# Patient Record
Sex: Female | Born: 1955 | Race: White | Hispanic: No | Smoking: Current every day smoker
Health system: Southern US, Community
[De-identification: ages and names within clinical notes are randomized; demographics above are authoritative.]

## PROBLEM LIST (undated history)

## (undated) DIAGNOSIS — I10 Essential (primary) hypertension: Secondary | ICD-10-CM

## (undated) DIAGNOSIS — F419 Anxiety disorder, unspecified: Secondary | ICD-10-CM

## (undated) DIAGNOSIS — R918 Other nonspecific abnormal finding of lung field: Secondary | ICD-10-CM

## (undated) DIAGNOSIS — E538 Deficiency of other specified B group vitamins: Secondary | ICD-10-CM

## (undated) DIAGNOSIS — I700 Atherosclerosis of aorta: Secondary | ICD-10-CM

## (undated) DIAGNOSIS — F32A Depression, unspecified: Secondary | ICD-10-CM

## (undated) DIAGNOSIS — R9389 Abnormal findings on diagnostic imaging of other specified body structures: Secondary | ICD-10-CM

## (undated) DIAGNOSIS — L409 Psoriasis, unspecified: Secondary | ICD-10-CM

## (undated) DIAGNOSIS — E669 Obesity, unspecified: Secondary | ICD-10-CM

## (undated) DIAGNOSIS — N393 Stress incontinence (female) (male): Secondary | ICD-10-CM

## (undated) DIAGNOSIS — E559 Vitamin D deficiency, unspecified: Secondary | ICD-10-CM

## (undated) DIAGNOSIS — R011 Cardiac murmur, unspecified: Secondary | ICD-10-CM

## (undated) DIAGNOSIS — Z720 Tobacco use: Secondary | ICD-10-CM

## (undated) DIAGNOSIS — E785 Hyperlipidemia, unspecified: Secondary | ICD-10-CM

## (undated) DIAGNOSIS — G479 Sleep disorder, unspecified: Secondary | ICD-10-CM

## (undated) DIAGNOSIS — F329 Major depressive disorder, single episode, unspecified: Secondary | ICD-10-CM

## (undated) HISTORY — DX: Hyperlipidemia, unspecified: E78.5

## (undated) HISTORY — DX: Psoriasis, unspecified: L40.9

## (undated) HISTORY — DX: Abnormal findings on diagnostic imaging of other specified body structures: R93.89

## (undated) HISTORY — DX: Deficiency of other specified B group vitamins: E53.8

## (undated) HISTORY — DX: Obesity, unspecified: E66.9

## (undated) HISTORY — DX: Tobacco use: Z72.0

## (undated) HISTORY — PX: MYOMECTOMY: SHX85

## (undated) HISTORY — DX: Depression, unspecified: F32.A

## (undated) HISTORY — DX: Essential (primary) hypertension: I10

## (undated) HISTORY — DX: Sleep disorder, unspecified: G47.9

## (undated) HISTORY — DX: Atherosclerosis of aorta: I70.0

## (undated) HISTORY — DX: Other nonspecific abnormal finding of lung field: R91.8

## (undated) HISTORY — DX: Cardiac murmur, unspecified: R01.1

## (undated) HISTORY — DX: Stress incontinence (female) (male): N39.3

## (undated) HISTORY — PX: TONSILLECTOMY: SUR1361

## (undated) HISTORY — DX: Anxiety disorder, unspecified: F41.9

## (undated) HISTORY — DX: Vitamin D deficiency, unspecified: E55.9

## (undated) HISTORY — DX: Major depressive disorder, single episode, unspecified: F32.9

---

## 2011-02-20 ENCOUNTER — Other Ambulatory Visit (HOSPITAL_COMMUNITY)
Admission: RE | Admit: 2011-02-20 | Discharge: 2011-02-20 | Disposition: A | Discharge status: Home / Self Care | Payer: 59 | Source: Ambulatory Visit | Attending: Family Medicine | Admitting: Family Medicine

## 2011-02-20 DIAGNOSIS — Z0000 Encounter for general adult medical examination without abnormal findings: Secondary | ICD-10-CM | POA: Insufficient documentation

## 2015-07-28 ENCOUNTER — Other Ambulatory Visit (HOSPITAL_COMMUNITY)
Admission: RE | Admit: 2015-07-28 | Discharge: 2015-07-28 | Disposition: A | Discharge status: Home / Self Care | Payer: 59 | Source: Ambulatory Visit | Attending: Physician Assistant | Admitting: Physician Assistant

## 2015-07-28 DIAGNOSIS — Z01419 Encounter for gynecological examination (general) (routine) without abnormal findings: Secondary | ICD-10-CM | POA: Diagnosis not present

## 2015-07-28 DIAGNOSIS — Z1151 Encounter for screening for human papillomavirus (HPV): Secondary | ICD-10-CM | POA: Insufficient documentation

## 2016-05-02 ENCOUNTER — Ambulatory Visit (INDEPENDENT_AMBULATORY_CARE_PROVIDER_SITE_OTHER): Payer: 59 | Admitting: Emergency Medicine

## 2016-05-02 VITALS — BP 128/80 | HR 81 | Temp 97.8°F | Resp 15 | Ht 59.0 in | Wt 185.0 lb

## 2016-05-02 DIAGNOSIS — W57XXXA Bitten or stung by nonvenomous insect and other nonvenomous arthropods, initial encounter: Secondary | ICD-10-CM

## 2016-05-02 DIAGNOSIS — S30861A Insect bite (nonvenomous) of abdominal wall, initial encounter: Secondary | ICD-10-CM | POA: Diagnosis not present

## 2016-05-02 MED ORDER — DOXYCYCLINE HYCLATE 100 MG PO TABS: ORAL_TABLET | ORAL | Status: DC

## 2016-05-02 NOTE — Progress Notes (Signed)
By signing my name below, I, Raven Small, attest that this documentation has been prepared under the direction and in the presence of Lesle ChrisSteven Uriah Trueba, MD.  Electronically Signed: Andrew Auaven Small, ED Scribe. 05/02/2016. 10:26 AM.  Chief Complaint:  Chief Complaint  Patient presents with  . Tick Removal    Left side. Pt states attempted to remove tick but head still there.     HPI: Lisa Montes is a 60 y.o. female who [facility] today for a tick removal from left flank. Pt mowed the lawn 3 days ago and also walks her dog every morning. She noticed tick while scratching her left side, scratching off the body of tick. She attempted to remove the head of tick herself. States her dog has been treated for ticks.    Past Medical History  Diagnosis Date  . Psoriasis   . Depression    No past surgical history on file. Social History   Social History  . Marital Status: Unknown    Spouse Name: N/A  . Number of Children: N/A  . Years of Education: N/A   Social History Main Topics  . Smoking status: Current Every Day Smoker  . Smokeless tobacco: None  . Alcohol Use: None  . Drug Use: None  . Sexual Activity: Not Asked   Other Topics Concern  . None   Social History Narrative   Family History  Problem Relation Age of Onset  . Hypertension Mother   . Hyperlipidemia Mother   . Kidney disease Mother   . Bladder Cancer Father   . Hypertension Sister   . Diabetes Mellitus I Sister   . Hyperlipidemia Sister   . Hypertension Brother   . Diabetes Mellitus I Paternal Grandfather    Allergies  Allergen Reactions  . Paxil [Paroxetine Hcl]     Lack of therapeutic effect  . Wellbutrin [Bupropion]     Lack of effect   Prior to Admission medications   Medication Sig Start Date End Date Taking? Authorizing Provider  clobetasol ointment (TEMOVATE) 0.05 % Apply 1 application topically 2 (two) times daily.    Historical Provider, MD  DULoxetine (CYMBALTA) 60 MG capsule Take 60 mg by mouth  daily.    Historical Provider, MD     ROS: The patient denies fevers, chills, night sweats, unintentional weight loss, chest pain, palpitations, wheezing, dyspnea on exertion, nausea, vomiting, abdominal pain, dysuria, hematuria, melena, numbness, weakness, or tingling.  All other systems have been reviewed and were otherwise negative with the exception of those mentioned in the HPI and as above.    PHYSICAL EXAM: Filed Vitals:   05/02/16 1004  BP: 128/80  Pulse: 81  Temp: 97.8 F (36.6 C)  Resp: 15   Body mass index is 37.35 kg/(m^2).   General: Alert, no acute distress HEENT:  Normocephalic, atraumatic, oropharynx patent. Eye: Nonie [facility]oyerOMI[facility] Cardiovascular:  Regular rate and rhythm, no rubs murmurs or gallops.  No Carotid bruits, radial pulse intact. No pedal edema.  Respiratory: Clear to auscultation bilaterally.  No wheezes, rales, or rhonchi.  No cyanosis, no use of accessory musculature Abdominal: No organomegaly, abdomen is soft and non-tender, positive bowel sounds.  No masses. Musculoskeletal: Gait intact. No edema, tenderness Skin: No rashes. There is a 3mm imbedded tick left lateral abdomen, area clean with alcohol partially removed forceps rest removed with 20 gauge needle. Pt tolerated procedure well without complications. Neurologic: Facial musculature symmetric. Psychiatric: Patient acts appropriately throughout our interaction. Lymphatic: No cervical or submandibular  lymphadenopathy   ASSESSMENT/PLAN: I used forceps and a 20-gauge needle to remove all of the tick fragments. The total tick size is very small worrisome for deer tick. I did go ahead and give her doxycycline prophylaxis. She was given information about tick diseases. She knows to be seen if she becomes ill anytime in the next 3 weeks.I personally performed the services described in this documentation, which was scribed in my presence. The recorded information has been reviewed and is accurate.   Gross  sideeffects, risk and benefits, and alternatives of medications d/w patient. Patient is aware that all medications have potential sideeffects and we are unable to predict every sideeffect or drug-drug interaction that may occur.  Lesle Chris MD 05/02/2016 10:26 AM

## 2016-05-02 NOTE — Patient Instructions (Addendum)
   IF you received an x-ray today, you will receive an invoice from [facility]. Please contact [facility] at [phone] with questions or concerns regarding your invoice.   IF you received labwork today, you will receive an invoice from [facility]. Please contact [facility] at [phone] with questions or concerns regarding your invoice.   Our billing staff will not be able to assist you with questions regarding bills from these companies.  You will be contacted with the lab results as soon as they are available. The fastest way to get your results is to activate your My Chart account. Instructions are located on the last page of this paperwork. If you have not heard from us regarding the results in 2 weeks, please contact this office.     Tick Bite Information Ticks are insects that attach themselves to the skin and draw blood for food. There are various types of ticks. Common types include wood ticks and deer ticks. Most ticks live in shrubs and grassy areas. Ticks can climb onto your body when you make contact with leaves or grass where the tick is waiting. The most common places on the body for ticks to attach themselves are the scalp, neck, armpits, waist, and groin. Most tick bites are harmless, but sometimes ticks carry germs that cause diseases. These germs can be spread to a person during the tick's feeding process. The chance of a disease spreading through a tick bite depends on:   The type of tick.  Time of year.   How long the tick is attached.   Geographic location.  HOW CAN YOU PREVENT TICK BITES? Take these steps to help prevent tick bites when you are outdoors:  Wear protective clothing. Long sleeves and long pants are best.   Wear white clothes so you can see ticks more easily.  Tuck your pant legs into your socks.   If walking on a trail, stay in the middle of the trail to avoid brushing against  bushes.  Avoid walking through areas with long grass.  Put insect repellent on all exposed skin and along boot tops, pant legs, and sleeve cuffs.   Check clothing, hair, and skin repeatedly and before going inside.   Brush off any ticks that are not attached.  Take a shower or bath as soon as possible after being outdoors.  WHAT IS THE PROPER WAY TO REMOVE A TICK? Ticks should be removed as soon as possible to help prevent diseases caused by tick bites. 1. If latex gloves are available, put them on before trying to remove a tick.  2. Using fine-point tweezers, grasp the tick as close to the skin as possible. You may also use curved forceps or a tick removal tool. Grasp the tick as close to its head as possible. Avoid grasping the tick on its body. 3. Pull gently with steady upward pressure until the tick lets go. Do not twist the tick or jerk it suddenly. This may break off the tick's head or mouth parts. 4. Do not squeeze or crush the tick's body. This could force disease-carrying fluids from the tick into your body.  5. After the tick is removed, wash the bite area and your hands with soap and water or other disinfectant such as alcohol. 6. Apply a small amount of antiseptic cream or ointment to the bite site.  7. Wash and disinfect any instruments that were used.  Do not try to remove a tick by applying a   hot match, petroleum jelly, or fingernail polish to the tick. These methods do not work and may increase the chances of disease being spread from the tick bite.  WHEN SHOULD YOU SEEK MEDICAL CARE? Contact your health care provider if you are unable to remove a tick from your skin or if a part of the tick breaks off and is stuck in the skin.  After a tick bite, you need to be aware of signs and symptoms that could be related to diseases spread by ticks. Contact your health care provider if you develop any of the following in the days or weeks after the tick bite:  Unexplained  fever.  Rash. A circular rash that appears days or weeks after the tick bite may indicate the possibility of Lyme disease. The rash may resemble a target with a bull's-eye and may occur at a different part of your body than the tick bite.  Redness and swelling in the area of the tick bite.   Tender, swollen lymph glands.   Diarrhea.   Weight loss.   Cough.   Fatigue.   Muscle, joint, or bone pain.   Abdominal pain.   Headache.   Lethargy or a change in your level of consciousness.  Difficulty walking or moving your legs.   Numbness in the legs.   Paralysis.  Shortness of breath.   Confusion.   Repeated vomiting.    This information is not intended to replace advice given to you by your health care provider. Make sure you discuss any questions you have with your health care provider.   Document Released: 11/17/2000 Document Revised: 12/11/2014 Document Reviewed: 04/30/2013 Elsevier Interactive Patient Education 2016 Elsevier Inc.  

## 2017-11-28 ENCOUNTER — Other Ambulatory Visit: Payer: Self-pay | Admitting: Physician Assistant

## 2017-11-28 DIAGNOSIS — S32010A Wedge compression fracture of first lumbar vertebra, initial encounter for closed fracture: Secondary | ICD-10-CM

## 2017-12-11 ENCOUNTER — Ambulatory Visit
Admission: RE | Admit: 2017-12-11 | Discharge: 2017-12-11 | Disposition: A | Discharge status: Home / Self Care | Payer: Managed Care, Other (non HMO) | Source: Ambulatory Visit | Attending: Physician Assistant | Admitting: Physician Assistant

## 2017-12-11 DIAGNOSIS — S32010A Wedge compression fracture of first lumbar vertebra, initial encounter for closed fracture: Secondary | ICD-10-CM

## 2018-12-24 IMAGING — MR MR LUMBAR SPINE W/O CM
5 series · 45 of 48 positions shown · non-contrast
Comparison: None.

CLINICAL DATA: Low back pain and left hip pain since a fall down
steps on 11/08/2017. Compression fracture of L1.

EXAM:
MRI LUMBAR SPINE WITHOUT CONTRAST
TECHNIQUE: Multiplanar, multisequence MR imaging of the lumbar spine was
performed. No intravenous contrast was administered.

[Series 3: T2 post-contrast · sagittal · 4.0mm · 0.81mm/px · 6 of 15 slices shown]
[im 1/15]
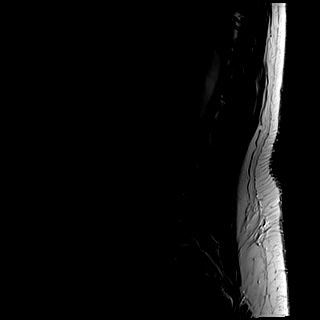
[im 3/15]
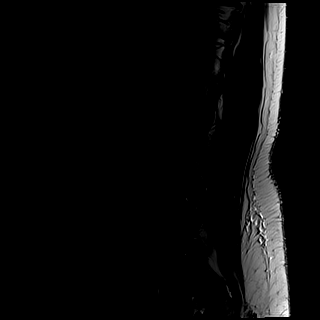
[im 6/15]
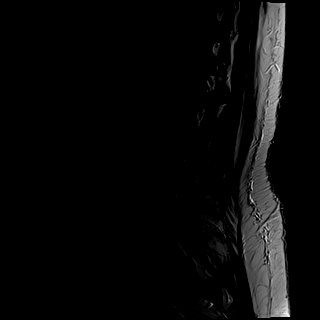
[im 9/15]
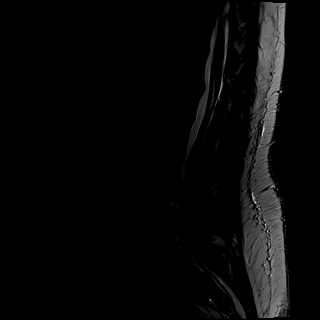
[im 12/15]
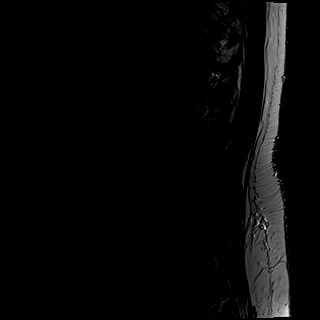
[im 15/15]
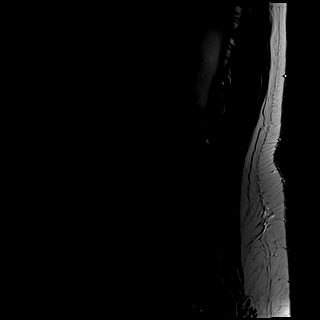

[Series 4: T1 · sagittal · 4.0mm · 0.81mm/px · 5 of 15 slices shown (1 of 2)]
[im 1/15]
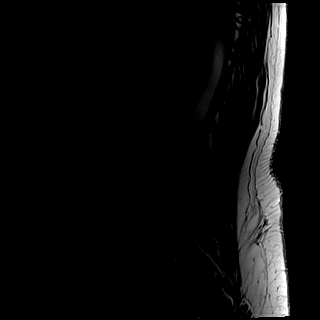
[im 4/15]
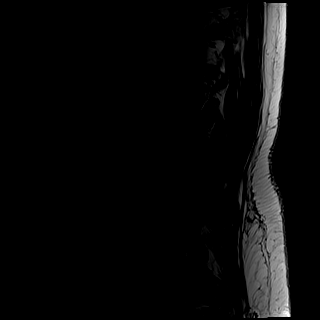
[im 8/15]
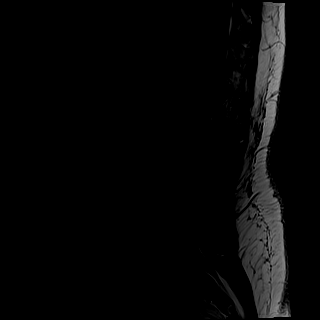
[im 11/15]
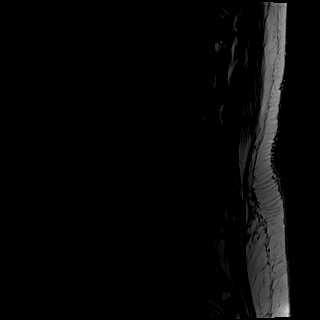
[im 15/15]
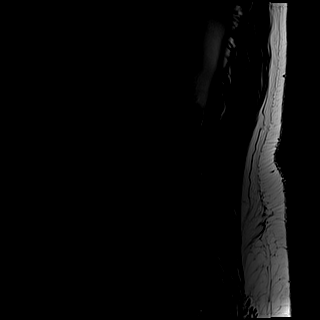

[Series 5: tirm sag · sagittal · 4.0mm · 0.51mm/px · 5 of 15 slices shown]
[im 1/15]
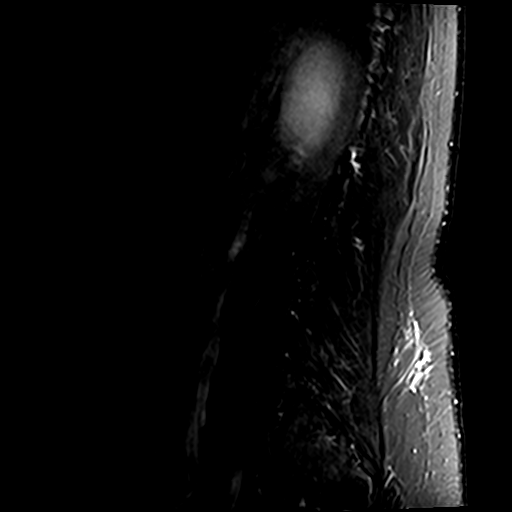
[im 4/15]
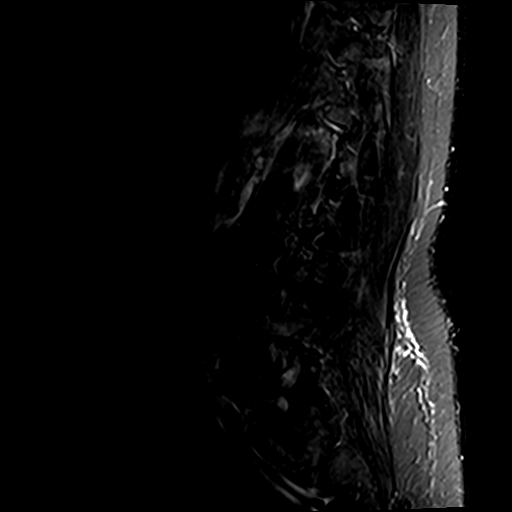
[im 8/15]
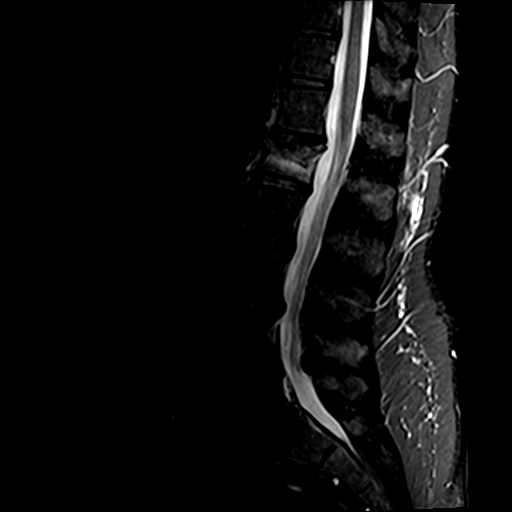
[im 11/15]
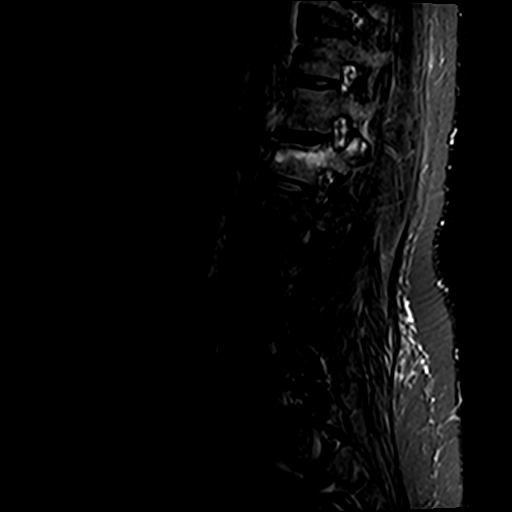
[im 15/15]
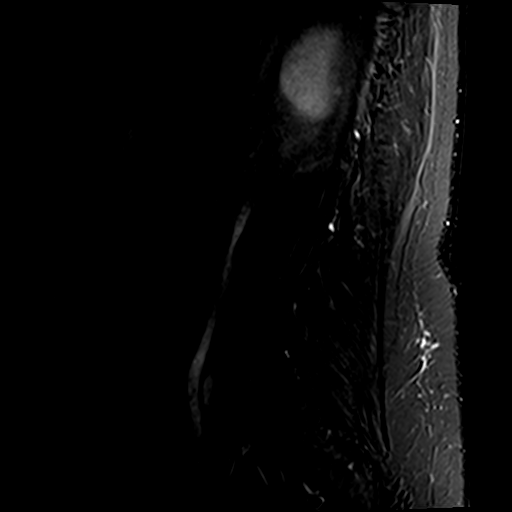

[Series 6: T1 · axial · 4.0mm · 0.78mm/px · z∈[-53,+184]mm · 13 of 45 slices shown (2 of 2)]
[im 1/45]
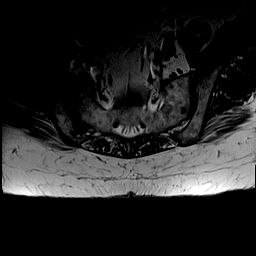
[im 3/45]
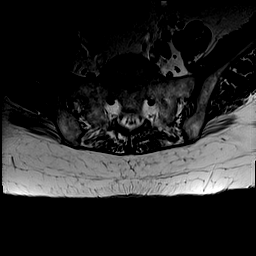
[im 6/45]
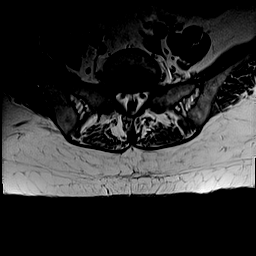
[im 9/45]
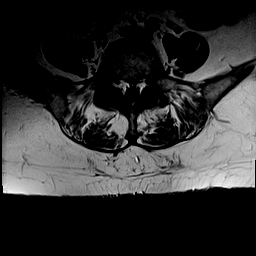
[im 12/45]
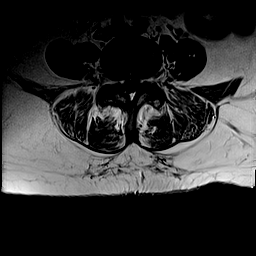
[im 15/45]
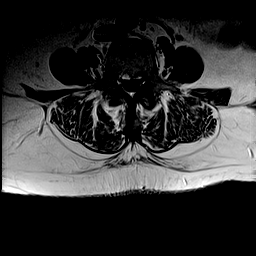
[im 18/45]
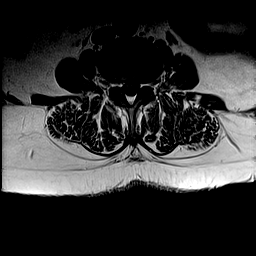
[im 21/45]
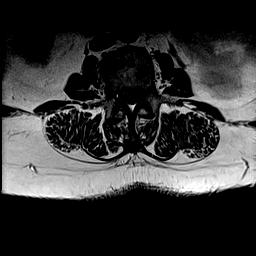
[im 24/45]
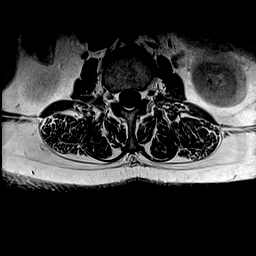
[im 27/45]
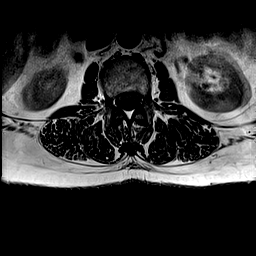
[im 33/45]
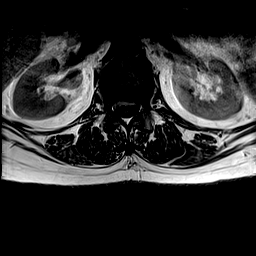
[im 39/45]
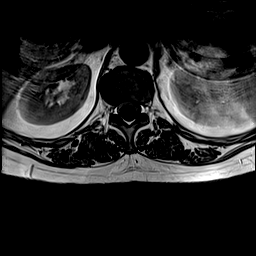
[im 45/45]
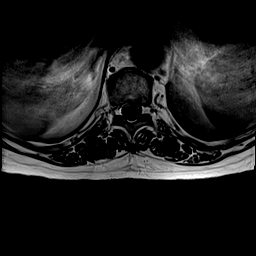

[Series 7: T2 · axial · 4.0mm · 0.78mm/px · z∈[-53,+184]mm · 16 of 45 slices shown]
[im 1/45]
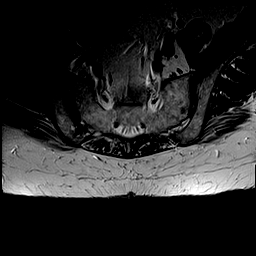
[im 3/45]
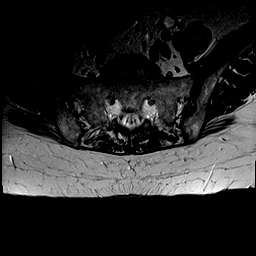
[im 6/45]
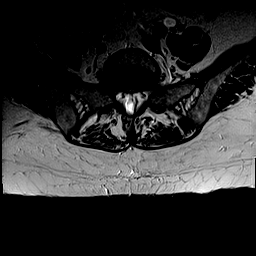
[im 9/45]
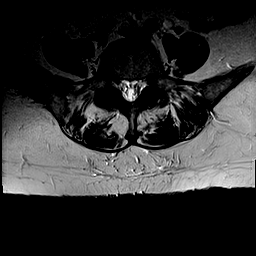
[im 12/45]
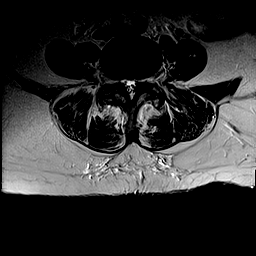
[im 15/45]
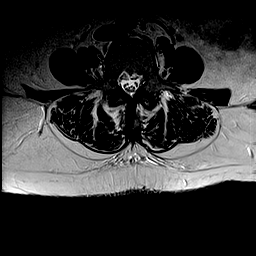
[im 18/45]
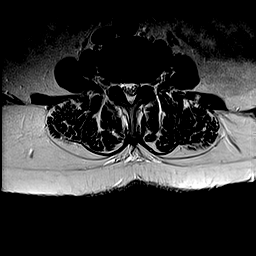
[im 21/45]
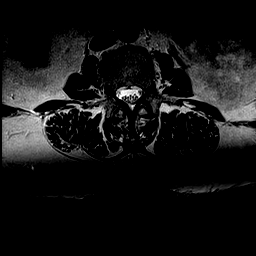
[im 24/45]
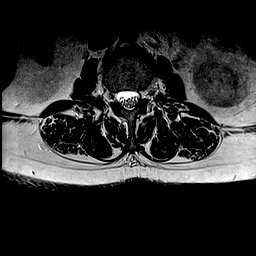
[im 27/45]
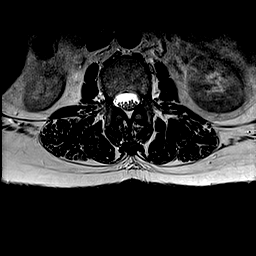
[im 30/45]
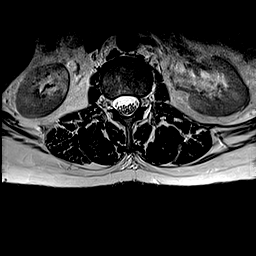
[im 33/45]
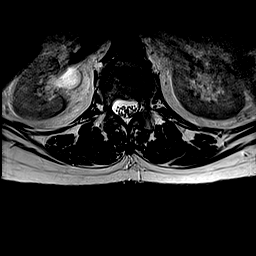
[im 36/45]
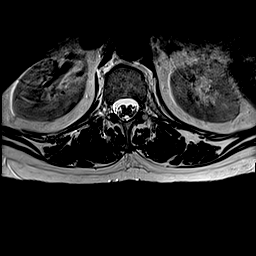
[im 39/45]
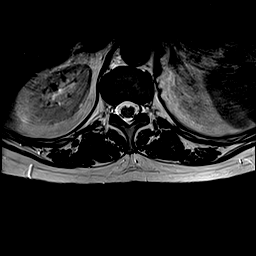
[im 42/45]
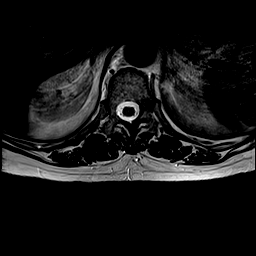
[im 45/45]
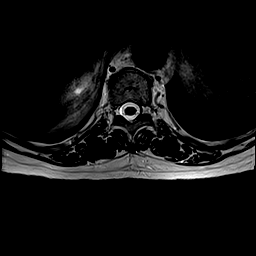

[45 of 48 positions shown; findings below may reference images not displayed]

FINDINGS: Segmentation:  Standard.

Alignment:  Physiologic.

Vertebrae: Benign-appearing mild compression fracture of the
superior endplate of L1 with slight protrusion of the
posterosuperior aspect of the L1 vertebral body into the spinal
canal without neural impingement. No disc protrusion. Small
Schmorl's node in the superior endplate of L3.

Conus medullaris and cauda equina: Conus extends to the L1 level.
Conus and cauda equina appear normal.

Paraspinal and other soft tissues: Negative.

Disc levels:

T10-11 and T11-12: Negative.

T12-L1: Benign-appearing acute or subacute mild compression fracture
of the superior endplate of L1 with slight protrusion of bone from
the posterosuperior aspect of L1 into the spinal canal without
neural impingement. No disc bulging or protrusion.

L1-2: Very tiny central disc bulge with no neural impingement.

L2-3: Slight disc space narrowing with a Schmorl's node in the
superior endplate of L3. Tiny broad-based disc bulge with no neural
impingement.

L3-4: Small broad-based soft disc protrusion slightly asymmetric to
the right compressing the right side of the thecal sac and slightly
posteriorly deviating the right L4 nerve within the thecal sac.

L4-5: Slight disc bulge asymmetric to the right without neural
impingement. Moderate bilateral facet arthritis with slight
hypertrophy of the ligamentum flavum without significant spinal
stenosis or lateral recess impingement.

L5-S1: No disc bulging or protrusion. Slight bilateral facet
arthritis.
IMPRESSION: 1. Benign-appearing acute or subacute mild compression fracture of
the superior plate of L1 without neural impingement.
2. Small soft disc protrusion at L3-4 asymmetric to the right with a
slight mass effect upon the thecal sac posteriorly deviating the
right L4 nerve rootlets within the thecal sac best seen on image 29
of series [DATE].

## 2020-02-20 ENCOUNTER — Ambulatory Visit: Payer: Managed Care, Other (non HMO) | Attending: Internal Medicine

## 2020-02-20 DIAGNOSIS — Z23 Encounter for immunization: Secondary | ICD-10-CM

## 2020-02-20 NOTE — Progress Notes (Signed)
   Covid-19 Vaccination Clinic  Name:  Lisa Montes    MRN: [id number] DOB: 1956-06-13  02/20/2020  Lisa Montes was observed post Covid-19 immunization for 15 minutes without incident. She was provided with Vaccine Information Sheet and instruction to access the V-Safe system.   Lisa Montes was instructed to call 911 with any severe reactions post vaccine: Marland Kitchen Difficulty breathing  . Swelling of face and throat  . A fast heartbeat  . A bad rash all over body  . Dizziness and weakness   Immunizations Administered    Name Date Dose VIS Date Route   Pfizer COVID-19 Vaccine 02/20/2020  3:57 PM 0.3 mL 11/14/2019 Intramuscular   Manufacturer: [employer], [employer]   Lot: [id number]   NDC: [address]-3

## 2020-03-17 ENCOUNTER — Ambulatory Visit: Payer: Managed Care, Other (non HMO) | Attending: Internal Medicine

## 2020-03-17 DIAGNOSIS — Z23 Encounter for immunization: Secondary | ICD-10-CM

## 2020-03-17 NOTE — Progress Notes (Signed)
   Covid-19 Vaccination Clinic  Name:  Lisa Montes    MRN: [id number] DOB: 1956/01/24  03/17/2020  Lisa Montes was observed post Covid-19 immunization for 15 minutes without incident. She was provided with Vaccine Information Sheet and instruction to access the V-Safe system.   Lisa Montes was instructed to call 911 with any severe reactions post vaccine: Marland Kitchen Difficulty breathing  . Swelling of face and throat  . A fast heartbeat  . A bad rash all over body  . Dizziness and weakness   Immunizations Administered    Name Date Dose VIS Date Route   Pfizer COVID-19 Vaccine 03/17/2020  3:40 PM 0.3 mL 11/14/2019 Intramuscular   Manufacturer: [employer], [employer]   Lot: [id number]   NDC: [address]-9

## 2023-03-09 ENCOUNTER — Other Ambulatory Visit: Payer: Self-pay | Admitting: Family Medicine

## 2023-03-09 DIAGNOSIS — Z720 Tobacco use: Secondary | ICD-10-CM

## 2023-03-09 DIAGNOSIS — Z122 Encounter for screening for malignant neoplasm of respiratory organs: Secondary | ICD-10-CM

## 2023-04-11 ENCOUNTER — Ambulatory Visit
Admission: RE | Admit: 2023-04-11 | Discharge: 2023-04-11 | Disposition: A | Discharge status: Home / Self Care | Payer: Managed Care, Other (non HMO) | Source: Ambulatory Visit | Attending: Family Medicine | Admitting: Family Medicine

## 2023-04-11 DIAGNOSIS — Z720 Tobacco use: Secondary | ICD-10-CM

## 2023-04-11 DIAGNOSIS — Z122 Encounter for screening for malignant neoplasm of respiratory organs: Secondary | ICD-10-CM

## 2023-04-18 ENCOUNTER — Telehealth: Payer: Self-pay

## 2023-04-18 NOTE — Telephone Encounter (Signed)
Received VM for this patient from [place] at [facility], requesting patient has urgent referral to lung screening.  Upon review of chart, patient had LDCT ordered by PCP with abnormal results [id number].  Attempted to call Londell Moh today and left a couple of VM's to please call us back to discuss.  Kandice Robinsons, NP reviewed results and would like patient to have consult with pulmonary nodule specialist.  Would request [facility] let patient know she is recommended for referral due to scan results.  Waiting for return call from [place].  Verified with their front desk staff that she is working today.

## 2023-04-19 ENCOUNTER — Encounter: Payer: Self-pay | Admitting: Emergency Medicine

## 2023-04-19 ENCOUNTER — Ambulatory Visit: Payer: Managed Care, Other (non HMO) | Admitting: Emergency Medicine

## 2023-04-19 VITALS — BP 140/84 | HR 80 | Temp 98.0°F | Ht 59.0 in | Wt 183.2 lb

## 2023-04-19 DIAGNOSIS — R918 Other nonspecific abnormal finding of lung field: Secondary | ICD-10-CM

## 2023-04-19 HISTORY — DX: Other nonspecific abnormal finding of lung field: R91.8

## 2023-04-19 LAB — CBC WITH DIFFERENTIAL/PLATELET
Basophils Absolute: 0.1 10*3/uL (ref 0.0–0.1)
Basophils Relative: 0.8 % (ref 0.0–3.0)
Eosinophils Absolute: 0.2 10*3/uL (ref 0.0–0.7)
Eosinophils Relative: 2.4 % (ref 0.0–5.0)
HCT: 39 % (ref 36.0–46.0)
Hemoglobin: 13.5 g/dL (ref 12.0–15.0)
Lymphocytes Relative: 19 % (ref 12.0–46.0)
Lymphs Abs: 1.6 10*3/uL (ref 0.7–4.0)
MCHC: 34.7 g/dL (ref 30.0–36.0)
MCV: 94.3 fl (ref 78.0–100.0)
Monocytes Absolute: 0.7 10*3/uL (ref 0.1–1.0)
Monocytes Relative: 8.9 % (ref 3.0–12.0)
Neutro Abs: 5.7 10*3/uL (ref 1.4–7.7)
Neutrophils Relative %: 68.9 % (ref 43.0–77.0)
Platelets: 319 10*3/uL (ref 150.0–400.0)
RBC: 4.13 Mil/uL (ref 3.87–5.11)
RDW: 13.4 % (ref 11.5–15.5)
WBC: 8.3 10*3/uL (ref 4.0–10.5)

## 2023-04-19 NOTE — Telephone Encounter (Signed)
Spoke with Londell Moh at [facility] and patient has appt 04/19/23 with Dr. Delton Coombes.

## 2023-04-19 NOTE — Addendum Note (Signed)
Addended by: Dorisann Frames R on: 04/19/2023 01:47 PM   Modules accepted: Orders

## 2023-04-19 NOTE — Progress Notes (Signed)
Subjective:    Patient ID: Lisa Montes, female    DOB: Jun 04, 1956, 67 y.o.   MRN: [id number]  HPI 67 year old woman with history of tobacco use (50 pack years), psoriasis, depression. She is referred today to discuss an abnormal lung cancer screening CT scan of the chest. She has a cough, can be productive, happens daily. She was treated for a bronchitis in Feb after a URI. Was given albuterol at that time but didn't need it.    Lung cancer screening CT chest 04/11/2023 reviewed by me, shows normal mediastinum without any lymphadenopathy.  No consolidation or effusion.  There are multiple scattered pulmonary nodules, the largest nodules in the superior segment of the right lower lobe approximately 9 mm.  There are nodules present also in the right upper lobe, right middle lobe, left lower lobe.  There is some nonspecific scattered groundglass infiltrate peribronchovascular prominence more so on the right than on the left   Review of Systems As per HPI  Past Medical History:  Diagnosis Date   Depression    Psoriasis      Family History  Problem Relation Age of Onset   Hypertension Mother    Hyperlipidemia Mother    Kidney disease Mother    Bladder Cancer Father    Hypertension Sister    Diabetes Mellitus I Sister    Hyperlipidemia Sister    Hypertension Brother    Diabetes Mellitus I Paternal Grandfather     No family hx lung CA Has lived in [place], [place] Cleaning chemicals No mold exposure  Social History   Socioeconomic History   Marital status: Unknown    Spouse name: Not on file   Number of children: Not on file   Years of education: Not on file   Highest education level: Not on file  Occupational History   Not on file  Tobacco Use   Smoking status: Every Day    Packs/day: 1.00    Years: 50.00    Additional pack years: 0.00    Total pack years: 50.00    Types: Cigarettes   Smokeless tobacco: Not on file   Tobacco comments:    10 cigarettes smoked daily ARJ 04/19/23   Substance and Sexual Activity   Alcohol use: Not on file   Drug use: Not on file   Sexual activity: Not on file  Other Topics Concern   Not on file  Social History Narrative   Not on file   Social Determinants of Health   Financial Resource Strain: Not on file  Food Insecurity: Not on file  Transportation Needs: Not on file  Physical Activity: Not on file  Stress: Not on file  Social Connections: Not on file  Intimate Partner Violence: Not on file    She is a medical   Allergies  Allergen Reactions   Paxil [Paroxetine Hcl]     Lack of therapeutic effect   Wellbutrin [Bupropion]     Lack of effect     Outpatient Medications Prior to Visit  Medication Sig Dispense Refill   atorvastatin (LIPITOR) 20 MG tablet SMARTSIG:1 Tablet(s) By Mouth Every Evening     DULoxetine (CYMBALTA) 60 MG capsule Take 60 mg by mouth daily.     lisinopril-hydrochlorothiazide (ZESTORETIC) 20-12.5 MG tablet Take 1 tablet by mouth daily.     clobetasol ointment (TEMOVATE) 0.05 % Apply 1 application topically 2 (two) times daily. (Patient not taking: Reported on 04/19/2023)     doxycycline (VIBRA-TABS) 100 MG tablet Take 2  tablets one dose with food but no dairy products. 2 tablet 0   No facility-administered medications prior to visit.        Objective:   Physical Exam  Vitals:   04/19/23 1301  BP: (!) 140/84  Pulse: 80  Temp: 98 F (36.7 C)  TempSrc: Oral  SpO2: 97%  Weight: 183 lb 3.2 oz (83.1 kg)  Height: 4\' 11"  (1.499 m)   Gen: Pleasant, overweight woman, in no distress,  normal affect  ENT: No lesions,  mouth clear,  oropharynx clear, no postnasal drip  Neck: No JVD, no stridor  Lungs: No use of accessory muscles, no crackles or wheezing on normal respiration, no wheeze on forced expiration  Cardiovascular: RRR, heart sounds normal, no murmur or gallops, no peripheral edema  Musculoskeletal: No deformities, no cyanosis or clubbing  Neuro: alert, awake, non focal  Skin:  Warm, no lesions or rash      Assessment & Plan:  Pulmonary nodules Scattered irregular pulmonary nodules admixed with groundglass infiltrate and some peribronchovascular prominence.  Pattern is consistent with an inflammatory process, consider RB-ILD or possibly related to her psoriasis.  Differential diagnosis also includes opportunistic infection, malignancy.  We discussed this today.  We will start the evaluation with a PET scan to better characterize these nodules.  Also hypersensitivity pneumonitis panel, QuantiFERON gold, CBC differential, ANCA, RF.  Depending on the results we will decide whether to follow with serial imaging versus pursue navigational bronchoscopy for tissue diagnosis, culture data.   Levy Pupa, MD, PhD 04/19/2023, 1:40 PM [facility] [phone] or if no answer before 7:00PM call [phone] For any issues after 7:00PM please call eLink [phone]

## 2023-04-19 NOTE — Patient Instructions (Signed)
We will arrange for blood work today We will arrange for a PET scan Continue to work on decreasing your cigarette smoking.  Ultimate goal will be to stop altogether. Follow Dr. Delton Coombes next available after your PET scan so we can review the results together.

## 2023-04-19 NOTE — Assessment & Plan Note (Signed)
Scattered irregular pulmonary nodules admixed with groundglass infiltrate and some peribronchovascular prominence.  Pattern is consistent with an inflammatory process, consider RB-ILD or possibly related to her psoriasis.  Differential diagnosis also includes opportunistic infection, malignancy.  We discussed this today.  We will start the evaluation with a PET scan to better characterize these nodules.  Also hypersensitivity pneumonitis panel, QuantiFERON gold, CBC differential, ANCA, RF.  Depending on the results we will decide whether to follow with serial imaging versus pursue navigational bronchoscopy for tissue diagnosis, culture data.

## 2023-04-20 LAB — ANGIOTENSIN CONVERTING ENZYME: Angiotensin-Converting Enzyme: 6 U/L — ABNORMAL LOW (ref 9–67)

## 2023-04-21 LAB — QUANTIFERON-TB GOLD PLUS
NIL: 0.02 IU/mL
TB2-NIL: 0.19 IU/mL

## 2023-04-21 LAB — RHEUMATOID FACTOR: Rheumatoid fact SerPl-aCnc: <10 IU/mL (ref <14)

## 2023-04-22 LAB — ANCA SCREEN W REFLEX TITER: ANCA SCREEN: NEGATIVE

## 2023-04-22 LAB — ANTI-NUCLEAR AB-TITER (ANA TITER): ANA Titer 1: 1:80 {titer} — ABNORMAL HIGH

## 2023-04-22 LAB — QUANTIFERON-TB GOLD PLUS
Mitogen-NIL: >10 IU/mL
QuantiFERON-TB Gold Plus: NEGATIVE
TB1-NIL: 0.25 IU/mL

## 2023-04-22 LAB — ANA: Anti Nuclear Antibody (ANA): POSITIVE — AB

## 2023-04-23 LAB — HYPERSENSITIVITY PNEUMONITIS
A. Pullulans Abs: NEGATIVE
A.Fumigatus #1 Abs: NEGATIVE
Micropolyspora faeni, IgG: NEGATIVE
Pigeon Serum Abs: NEGATIVE
Thermoact. Saccharii: NEGATIVE
Thermoactinomyces vulgaris, IgG: NEGATIVE

## 2023-05-10 ENCOUNTER — Encounter (HOSPITAL_COMMUNITY)
Admission: RE | Admit: 2023-05-10 | Discharge: 2023-05-10 | Disposition: A | Discharge status: Home / Self Care | Payer: Managed Care, Other (non HMO) | Source: Ambulatory Visit | Attending: Emergency Medicine | Admitting: Emergency Medicine

## 2023-05-10 DIAGNOSIS — R918 Other nonspecific abnormal finding of lung field: Secondary | ICD-10-CM | POA: Insufficient documentation

## 2023-05-10 LAB — GLUCOSE, CAPILLARY: Glucose-Capillary: 101 mg/dL — ABNORMAL HIGH (ref 70–99)

## 2023-05-10 MED ORDER — FLUDEOXYGLUCOSE F - 18 (FDG) INJECTION
9.7500 | Freq: Once | INTRAVENOUS | Status: AC | PRN
  Administered 2023-05-10: 9.75 via INTRAVENOUS

## 2023-05-18 ENCOUNTER — Ambulatory Visit: Payer: Managed Care, Other (non HMO) | Admitting: Emergency Medicine

## 2023-05-18 ENCOUNTER — Encounter: Payer: Self-pay | Admitting: Emergency Medicine

## 2023-05-18 VITALS — BP 132/72 | HR 83 | Temp 98.4°F | Ht 59.0 in | Wt 181.2 lb

## 2023-05-18 DIAGNOSIS — R918 Other nonspecific abnormal finding of lung field: Secondary | ICD-10-CM | POA: Diagnosis not present

## 2023-05-18 NOTE — Progress Notes (Signed)
Subjective:    Patient ID: Lisa Montes, female    DOB: 11/01/1956, 67 y.o.   MRN: [id number]  HPI 67 year old woman with history of tobacco use (50 pack years), psoriasis, depression. She is referred today to discuss an abnormal lung cancer screening CT scan of the chest. She has a cough, can be productive, happens daily. She was treated for a bronchitis in Feb after a URI. Was given albuterol at that time but didn't need it.   Lung cancer screening CT chest 04/11/2023 reviewed by me, shows normal mediastinum without any lymphadenopathy.  No consolidation or effusion.  There are multiple scattered pulmonary nodules, the largest nodules in the superior segment of the right lower lobe approximately 9 mm.  There are nodules present also in the right upper lobe, right middle lobe, left lower lobe.  There is some nonspecific scattered groundglass infiltrate peribronchovascular prominence more so on the right than on the left   ROV 05/18/2023.  Follow-up visit for 67 year old woman with psoriasis, history of tobacco use.  I saw her 1 month ago after a lung cancer screening CT chest showed scattered pulmonary nodules and some associated groundglass infiltrate, largest in the right lower lobe superior segment 9 mm.  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  Labs 04/19/2023 weakly positive ANA 1: 80, QuantiFERON gold negative, normal eosinophil count on differential, ANCA negative, ACE level 6 (normal), RF negative, HSP panel negative  PET scan 05/10/2023 reviewed by me shows pulmonary nodules stable in number and appearance without any evidence of hypermetabolism.  There are no hypermetabolic nodes or any other concerning findings  Review of Systems As per HPI  Past Medical History:  Diagnosis Date   Depression    Psoriasis      Family History  Problem Relation Age of Onset   Hypertension Mother    Hyperlipidemia Mother    Kidney disease Mother    Bladder Cancer Father    Hypertension Sister     Diabetes Mellitus I Sister    Hyperlipidemia Sister    Hypertension Brother    Diabetes Mellitus I Paternal Grandfather     No family hx lung CA Has lived in [place], [place] Cleaning chemicals No mold exposure  Social History   Socioeconomic History   Marital status: Unknown    Spouse name: Not on file   Number of children: Not on file   Years of education: Not on file   Highest education level: Not on file  Occupational History   Not on file  Tobacco Use   Smoking status: Every Day    Packs/day: 1.00    Years: 50.00    Additional pack years: 0.00    Total pack years: 50.00    Types: Cigarettes   Smokeless tobacco: Not on file   Tobacco comments:    10 cigarettes smoked daily ARJ 05/18/23  Substance and Sexual Activity   Alcohol use: Not on file   Drug use: Not on file   Sexual activity: Not on file  Other Topics Concern   Not on file  Social History Narrative   Not on file   Social Determinants of Health   Financial Resource Strain: Not on file  Food Insecurity: Not on file  Transportation Needs: Not on file  Physical Activity: Not on file  Stress: Not on file  Social Connections: Not on file  Intimate Partner Violence: Not on file    She is a medical   Allergies  Allergen Reactions   Paxil [Paroxetine  Hcl]     Lack of therapeutic effect   Wellbutrin [Bupropion]     Lack of effect     Outpatient Medications Prior to Visit  Medication Sig Dispense Refill   atorvastatin (LIPITOR) 20 MG tablet SMARTSIG:1 Tablet(s) By Mouth Every Evening     buPROPion (WELLBUTRIN XL) 150 MG 24 hr tablet      clobetasol ointment (TEMOVATE) 0.05 % Apply 1 application  topically 2 (two) times daily.     lisinopril-hydrochlorothiazide (ZESTORETIC) 20-12.5 MG tablet Take 1 tablet by mouth daily.     doxycycline (VIBRA-TABS) 100 MG tablet Take 2 tablets one dose with food but no dairy products. 2 tablet 0   DULoxetine (CYMBALTA) 60 MG capsule Take 60 mg by mouth daily. (Patient not  taking: Reported on 05/18/2023)     No facility-administered medications prior to visit.        Objective:   Physical Exam  Vitals:   05/18/23 0837  BP: 132/72  Pulse: 83  Temp: 98.4 F (36.9 C)  TempSrc: Oral  SpO2: 95%  Weight: 181 lb 3.2 oz (82.2 kg)  Height: 4\' 11"  (1.499 m)   Gen: Pleasant, overweight woman, in no distress,  normal affect  ENT: No lesions,  mouth clear,  oropharynx clear, no postnasal drip  Neck: No JVD, no stridor  Lungs: No use of accessory muscles, no crackles or wheezing on normal respiration, no wheeze on forced expiration  Cardiovascular: RRR, heart sounds normal, no murmur or gallops, no peripheral edema  Musculoskeletal: No deformities, no cyanosis or clubbing  Neuro: alert, awake, non focal  Skin: Warm, no lesions or rash      Assessment & Plan:  Pulmonary nodules Multiple nodules.  Inflammatory lab screening negative.  Discussed this with her today.  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  Her PET scan was reassuring, no hypermetabolism.  Plan will be to repeat a CT in 6 months.  If there is an interval change, any change in suspicion then we will consider bronchoscopy for culture data and tissue diagnosis.   Levy Pupa, MD, PhD 05/18/2023, 9:18 AM [facility] [phone] or if no answer before 7:00PM call [phone] For any issues after 7:00PM please call eLink [phone]

## 2023-05-18 NOTE — Patient Instructions (Signed)
We reviewed your PET scan and lab work today.  All reassuring. We will plan to repeat a CT scan of the chest in December 2024. Try to work on decreasing your cigarettes.  Ultimate goal will be to stop altogether. Follow Dr. Delton Coombes in December after your CT so we can review that result together.

## 2023-05-18 NOTE — Assessment & Plan Note (Signed)
Multiple nodules.  Inflammatory lab screening negative.  Discussed this with her today.  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  Her PET scan was reassuring, no hypermetabolism.  Plan will be to repeat a CT in 6 months.  If there is an interval change, any change in suspicion then we will consider bronchoscopy for culture data and tissue diagnosis.

## 2023-11-09 ENCOUNTER — Ambulatory Visit
Admission: RE | Admit: 2023-11-09 | Discharge: 2023-11-09 | Disposition: A | Discharge status: Home / Self Care | Payer: Medicare Other | Source: Ambulatory Visit | Attending: Emergency Medicine | Admitting: Emergency Medicine

## 2023-11-09 DIAGNOSIS — R918 Other nonspecific abnormal finding of lung field: Secondary | ICD-10-CM

## 2023-12-04 ENCOUNTER — Ambulatory Visit: Payer: Medicare Other | Admitting: Adult Health

## 2023-12-04 ENCOUNTER — Encounter: Payer: Self-pay | Admitting: Adult Health

## 2023-12-04 VITALS — BP 130/70 | HR 73 | Temp 98.0°F | Ht 59.0 in | Wt 168.2 lb

## 2023-12-04 DIAGNOSIS — F17210 Nicotine dependence, cigarettes, uncomplicated: Secondary | ICD-10-CM | POA: Diagnosis not present

## 2023-12-04 DIAGNOSIS — J432 Centrilobular emphysema: Secondary | ICD-10-CM

## 2023-12-04 DIAGNOSIS — R918 Other nonspecific abnormal finding of lung field: Secondary | ICD-10-CM | POA: Diagnosis not present

## 2023-12-04 NOTE — Progress Notes (Signed)
 @Patient  ID: Lisa Montes, female    DOB: [birth year], 67 y.o.   MRN: [id number]  Chief Complaint  Patient presents with   Follow-up   Discussed the use of AI scribe software for clinical note transcription with the patient, who gave verbal consent to proceed.  Referring provider: No ref. provider found  HPI: 67 yo female smoker seen for pulmonary consult 04/19/23 for abnormal CT chest with lung nodules   TEST/EVENTS :   12/04/2023 Follow up ; Lung nodules  Patient returns for 55-month follow-up.  Patient was seen for pulmonary consult Apr 19, 2023 for abnormal CT chest.  Lung cancer screening CT chest Apr 11, 2023 showed suspicious right lower lobe lung nodule measuring 8.6 mm.  Patient was set up for a PET scan May 10, 2023 that showed stable bilateral pulmonary nodules with no hypermetabolic activity.  Patient was recommended for a surveillance CT.  This was completed November 09, 2023 that showed scattered small pulmonary nodules that are similar to Apr 11, 2023.  Largest measuring 9 mm.  We discussed her CT results in detail.  Discussed a follow-up CT will be indicated in 1 year and she can return back to the lung cancer CT screening program.  CT chest did show emphysema and some interstitial accentuation in the upper lobes right later than left.  We discussed smoking cessation.  Patient has an intermittent cough.  She denies any fever or discolored mucus.  She has no significant shortness of breath. She does continue smoke.  We discussed smoking cessation. She has been engaging in regular walking exercises  The patient is a long-term smoker, currently smoking about ten cigarettes a day. They have tried quitting in the past using nicotine patches and gum, but experienced adverse effects such as a weird feeling in the head and stomach upset. The patient acknowledges the need to quit smoking but finds the thought process overwhelming due to the long history of smoking.       Allergies   Allergen Reactions   Paxil [Paroxetine Hcl]     Lack of therapeutic effect   Wellbutrin [Bupropion]     Lack of effect    Immunization History  Administered Date(s) Administered   Influenza,inj,Quad PF,6+ Mos 09/04/2023   PFIZER(Purple Top)SARS-COV-2 Vaccination 02/20/2020, 03/17/2020    Past Medical History:  Diagnosis Date   Depression    Psoriasis     Tobacco History: Social History   Tobacco Use  Smoking Status Every Day   Current packs/day: 1.00   Average packs/day: 1 pack/day for 50.0 years (50.0 ttl pk-yrs)   Types: Cigarettes  Smokeless Tobacco Not on file  Tobacco Comments   10 cigarettes smoked daily hfb 12/04/2023   Ready to quit: No Counseling given: Yes Tobacco comments: 10 cigarettes smoked daily hfb 12/04/2023   Outpatient Medications Prior to Visit  Medication Sig Dispense Refill   atorvastatin (LIPITOR) 20 MG tablet SMARTSIG:1 Tablet(s) By Mouth Every Evening     buPROPion (WELLBUTRIN XL) 150 MG 24 hr tablet      clobetasol ointment (TEMOVATE) 0.05 % Apply 1 application  topically 2 (two) times daily.     FLUoxetine (PROZAC) 40 MG capsule Take 40 mg by mouth daily.     lisinopril-hydrochlorothiazide (ZESTORETIC) 20-12.5 MG tablet Take 1 tablet by mouth daily.     doxycycline  (VIBRA -TABS) 100 MG tablet Take 2 tablets one dose with food but no dairy products. 2 tablet 0   DULoxetine (CYMBALTA) 60 MG capsule Take 60 mg by  mouth daily. (Patient not taking: Reported on 12/04/2023)     No facility-administered medications prior to visit.     Review of Systems:   Constitutional:   No  weight loss, night sweats,  Fevers, chills, fatigue, or  lassitude.  HEENT:   No headaches,  Difficulty swallowing,  Tooth/dental problems, or  Sore throat,                No sneezing, itching, ear ache, nasal congestion, post nasal drip,   CV:  No chest pain,  Orthopnea, PND, swelling in lower extremities, anasarca, dizziness, palpitations, syncope.   GI  No  heartburn, indigestion, abdominal pain, nausea, vomiting, diarrhea, change in bowel habits, loss of appetite, bloody stools.   Resp: No shortness of breath with exertion or at rest.  No excess mucus, no productive cough,  No non-productive cough,  No coughing up of blood.  No change in color of mucus.  No wheezing.  No chest wall deformity  Skin: no rash or lesions.  GU: no dysuria, change in color of urine, no urgency or frequency.  No flank pain, no hematuria   MS:  No joint pain or swelling.  No decreased range of motion.  No back pain.    Physical Exam  BP 130/70 (BP Location: Left Arm, Patient Position: Sitting, Cuff Size: Large)   Pulse 73   Temp 98 F (36.7 C) (Oral)   Ht 4' 11 (1.499 m)   Wt 168 lb 3.2 oz (76.3 kg)   SpO2 96%   BMI 33.97 kg/m   GEN: A/Ox3; pleasant , NAD, well nourished    HEENT:  [place]/AT,  EACs-clear, TMs-wnl, NOSE-clear, THROAT-clear, no lesions, no postnasal drip or exudate noted.   NECK:  Supple w/ fair ROM; no JVD; normal carotid impulses w/o bruits; no thyromegaly or nodules palpated; no lymphadenopathy.    RESP  Clear  P & A; w/o, wheezes/ rales/ or rhonchi. no accessory muscle use, no dullness to percussion  CARD:  RRR, no m/r/g, no peripheral edema, pulses intact, no cyanosis or clubbing.  GI:   Soft & nt; nml bowel sounds; no organomegaly or masses detected.   Musco: Warm bil, no deformities or joint swelling noted.   Neuro: alert, no focal deficits noted.    Skin: Warm, no lesions or rashes    Lab Results:  CBC    Component Value Date/Time   WBC 8.3 04/19/2023 1351   RBC 4.13 04/19/2023 1351   HGB 13.5 04/19/2023 1351   HCT 39.0 04/19/2023 1351   PLT 319.0 04/19/2023 1351   MCV 94.3 04/19/2023 1351   MCHC 34.7 04/19/2023 1351   RDW 13.4 04/19/2023 1351   LYMPHSABS 1.6 04/19/2023 1351   MONOABS 0.7 04/19/2023 1351   EOSABS 0.2 04/19/2023 1351   BASOSABS 0.1 04/19/2023 1351    BMET No results found for: NA, K, CL,  CO2, GLUCOSE, BUN, CREATININE, CALCIUM, GFRNONAA, GFRAA  BNP No results found for: BNP  ProBNP No results found for: PROBNP  Imaging: CT Super D Chest Wo Contrast Result Date: 11/20/2023 CLINICAL DATA:  Pulmonary nodule surveillance EXAM: CT CHEST WITHOUT CONTRAST TECHNIQUE: Multidetector CT imaging of the chest was performed using thin slice collimation for electromagnetic bronchoscopy planning purposes, without intravenous contrast. RADIATION DOSE REDUCTION: This exam was performed according to the departmental dose-optimization program which includes automated exposure control, adjustment of the mA and/or kV according to patient size and/or use of iterative reconstruction technique. COMPARISON:  PET-CT 05/10/2023 and chest CT  04/11/2023 FINDINGS: Cardiovascular: Coronary, aortic arch, and branch vessel atherosclerotic vascular disease. Mediastinum/Nodes: Unremarkable Lungs/Pleura: About 10 scattered small pulmonary nodules are similar to prior. Largest: 9 by 7 by 3 mm (volume = 100 mm^3) right lower lobe nodule on image 65 series 5. These nodules were not hypermetabolic on 05/10/2023. No new or specifically enlarging nodule is identified. Emphysema noted with some chronic hazy reticular interstitial accentuation in the upper lobes, right greater than left. Upper Abdomen: Abdominal aortic atherosclerosis. Musculoskeletal: Thoracic spondylosis. Remote L1 superior endplate compression fracture. IMPRESSION: 1. About 10 scattered small pulmonary nodules are similar to 04/11/2023, the largest 100 cubic mm in the right lower lobe. These nodules were not hypermetabolic on 05/10/2023. No new or specifically enlarging nodule is identified. We have now documented 7 months of stability. The patient can return to lung cancer screening in 1 years time. Otherwise, follow up chest CT 12 months from today is considered optional for low-risk patients, but is recommended for high-risk patients. This  recommendation follows the consensus statement: Guidelines for Management of Incidental Pulmonary Nodules Detected on CT Images: From the Fleischner Society 2017; Radiology 2017; 284:228-243. 2. Coronary, aortic arch, and branch vessel atherosclerotic vascular disease. 3. Thoracic spondylosis. 4. Remote L1 superior endplate compression fracture. 5. Emphysema. Aortic Atherosclerosis (ICD10-I70.0). Electronically Signed   By: Ryan Salvage M.D.   On: 11/20/2023 10:48    Administration History     None           No data to display          No results found for: NITRICOXIDE      Assessment & Plan:   Assessment and Plan    Pulmonary Nodules Small scattered pulmonary nodules, with the largest being 9 mm, have remained unchanged on the recent CT scan. The previous PET scan indicated no hypermetabolic activity or lymph node involvement, suggesting stability in nodule size and absence of hypermetabolic adenopathy, which are positive indicators.  She will need a follow-up CT chest in December 2025 and I can return back to the lung cancer CT screening program.     Emphysema Their emphysema is likely related to smoking. They have no dyspnea . Mild intermittent cough. We emphasized smoking cessation to prevent progression .SABRA We will perform spirometry at the six-month follow-up, encourage smoking cessation, and discuss the benefits of quitting smoking.  Smoking Cessation They are interested in quitting smoking but find it overwhelming. They have had limited success with nicotine patches and gum. We discussed gradual reduction strategies, setting a quit date, and using lower dose nicotine patches after reducing to five cigarettes per day.   General Health Maintenance They are up to date on flu and pneumonia vaccines. We discussed the benefits of the new RSV vaccine, which is available at pharmacies. We recommend the RSV vaccine.  Follow-up We will schedule a six-month follow-up  visit and perform spirometry at the follow-up.       Madelin Stank, NP 12/04/2023

## 2023-12-04 NOTE — Patient Instructions (Addendum)
 Work on not smoking.  RSV vaccine as discussed  Lung cancer screening CT chest in Dec 2025  Follow up with Dr. Delton Coombes  in 6 months with PFT.

## 2024-09-10 ENCOUNTER — Ambulatory Visit: Attending: Cardiology | Admitting: Cardiology

## 2024-09-10 ENCOUNTER — Encounter: Payer: Self-pay | Admitting: Cardiology

## 2024-09-10 VITALS — BP 126/76 | HR 74 | Ht 59.0 in | Wt 159.2 lb

## 2024-09-10 DIAGNOSIS — I700 Atherosclerosis of aorta: Secondary | ICD-10-CM | POA: Diagnosis not present

## 2024-09-10 DIAGNOSIS — R0989 Other specified symptoms and signs involving the circulatory and respiratory systems: Secondary | ICD-10-CM | POA: Insufficient documentation

## 2024-09-10 DIAGNOSIS — I10 Essential (primary) hypertension: Secondary | ICD-10-CM | POA: Insufficient documentation

## 2024-09-10 DIAGNOSIS — E785 Hyperlipidemia, unspecified: Secondary | ICD-10-CM | POA: Diagnosis not present

## 2024-09-10 DIAGNOSIS — R011 Cardiac murmur, unspecified: Secondary | ICD-10-CM | POA: Diagnosis not present

## 2024-09-10 NOTE — Patient Instructions (Signed)
 Medication Instructions:  Continue same medications *If you need a refill on your cardiac medications before your next appointment, please call your pharmacy*  Lab Work: None ordered  Testing/Procedures: Echo   first available  Carotid dopplers  Follow-Up: At [facility], you and your health needs are our priority.  As part of our continuing mission to provide you with exceptional heart care, our providers are all part of one team.  This team includes your primary Cardiologist (physician) and Advanced Practice Providers or APPs (Physician Assistants and Nurse Practitioners) who all work together to provide you with the care you need, when you need it.  Your next appointment:  12/2024    Provider:  Dr.Harding   We recommend signing up for the patient portal called MyChart.  Sign up information is provided on this After Visit Summary.  MyChart is used to connect with patients for Virtual Visits (Telemedicine).  Patients are able to view lab/test results, encounter notes, upcoming appointments, etc.  Non-urgent messages can be sent to your provider as well.   To learn more about what you can do with MyChart, go to [email or web address].

## 2024-09-10 NOTE — Progress Notes (Unsigned)
 Cardiology Office Note:  .   Date:  09/11/2024  ID:  Lisa Montes, DOB 1956-03-03, MRN [id number] PCP: Scifres, Naomie RIGGERS (Inactive)  [facility] Providers Cardiologist:  None     Chief Complaint  Patient presents with   New Patient (Initial Visit)    No symptoms   Cardiac Valve Problem    Murmur heard on exam    Patient Profile: .     Lisa Montes is a 68 y.o. female smoker  with a PMH notable for hypertension hyperlipidemia and aortic sclerosis who presents here for evaluation of a heart murmur.  Referred at the request of Cristopher Bottcher, NP.  She reports that she was told that she had Rheumatic Fever as a child.    Lisa Montes was seen by Bottcher Evener, NP on 09/05/2024 for medication follow-up.  Noted that her leg aching improved with reduced dose of statin.  Also some consideration of potential sciatica.  Cut down to 8 cigarettes a day and following up with pulmonary for known lung nodules.  No cardiac symptoms.  Murmur heard on exam-referred to cardiology.  Subjective  Discussed the use of AI scribe software for clinical note transcription with the patient, who gave verbal consent to proceed.  History of Present Illness Lisa Montes is a 68 year old female with hypertension, hyperlipidemia, and aortic atherosclerosis who presents for evaluation of a heart murmur. She was referred by her primary care physician for evaluation of a heart murmur.  She is here for an initial consultation to evaluate a heart murmur. Her primary care physician detected the murmur during a recent examination, but she has not experienced any symptoms related to it. No diagnostic tests have been ordered prior to this visit for the murmur. No symptoms of chest pain, shortness of breath, heart palpitations, dizziness, or leg swelling. She does not experience pain in her calves or thighs that worsens with walking; if she is stiff, she may have some discomfort that improves after walking. She has a history  of a childhood murmur possibly related to rheumatic fever, but it resolved and has not been mentioned in adulthood until now.  Her medical history includes hypertension, hyperlipidemia, and aortic atherosclerosis. Her current medications are lisinopril HCTZ 20/12.5 mg for blood pressure, atorvastatin 20 mg (taking half a tablet daily) for lipids, Wellbutrin 150 mg, and Prozac 40 mg. She also takes a multivitamin.  She has a history of tobacco use and is currently smoking eight cigarettes a day. She has been referred to pulmonary medicine for lung nodules.  In terms of family history, her mother had coronary artery disease and underwent bypass surgery in her early 59. Her mother was also a smoker. Her father had bladder cancer. She has a sister with diabetes and morbid obesity.  Recent lab work from October 3rd shows a total cholesterol of 166 mg/dL, HDL of 60 mg/dL, triglycerides of 91 mg/dL, and LDL of 89 mg/dL.  Cardiovascular ROS: no chest pain or dyspnea on exertion positive for - maybe little exercise intolerance as she is somewhat sedentary.  A little short of breath but not unexpected with smoking. negative for - edema, irregular heartbeat, orthopnea, palpitations, paroxysmal nocturnal dyspnea, rapid heart rate, shortness of breath, or lightheadedness, dizziness or wooziness, syncope/near syncope or CVA/TIA/amaurosis fugax, claudication  ROS:  Review of Systems - Negative except symptoms noted above    Objective   Past Medical History - Hypertension - Hyperlipidemia - Aortic atherosclerosis - Tobacco use disorder - Lung nodules -  Anxiety - Rheumatic fever (childhood)  Family History - Mother: heart disease, bypass surgery - Father: bladder cancer - Brother: died from the flu,  - Sister: diabetes, morbid obesity - No family history of heart disease in father.  Social History - Tobacco: Current smoker, 0.4 ppd - Employment: Retired - Patient stays active by mowing  the yard and exercising regularly.  Medications - Lisinopril HCTZ 20/12.5 mg - Atorvastatin 20 mg 1/2 tab a day - Wellbutrin 150 mg - Prozac 40 mg - Multivitamin  Studies Reviewed: SABRA   EKG Interpretation Date/Time:  Wednesday September 10 2024 09:37:34 EDT Ventricular Rate:  74 PR Interval:  164 QRS Duration:  80 QT Interval:  376 QTC Calculation: 417 R Axis:   16  Text Interpretation: Normal sinus rhythm Low voltage QRS No previous ECGs available Confirmed by Anner Lenis ([id number]) on 09/11/2024 11:25:48 AM    Results LABS Total Cholesterol: 166 (09/05/2024) HDL: 60 (09/05/2024) Triglycerides: 91 (09/05/2024) LDL: 89 (09/05/2024)  RADIOLOGY Chest CT Scan: 10 scattered small pulmonary nodules similar to 04/11/2023.  Emphysema coronary, aortic arch and branch vessel atherosclerotic disease.  (11/2023)   Risk Assessment/Calculations:             Physical Exam:   VS:  BP 126/76   Pulse 74   Ht 4' 11 (1.499 m)   Wt 159 lb 3.2 oz (72.2 kg)   SpO2 97%   BMI 32.15 kg/m    Wt Readings from Last 3 Encounters:  09/10/24 159 lb 3.2 oz (72.2 kg)  12/04/23 168 lb 3.2 oz (76.3 kg)  05/18/23 181 lb 3.2 oz (82.2 kg)    Physical Exam GEN: Mostly healthy-appearing well nourished, well groomed in no acute distress; mildly obese NECK: No JVD; No carotid bruits CARDIAC: RRR, Normal S1, S2; harsh 3/ SEM at RUSB, likely aortic valve. No clubbing, cyanosis, or edema. Normal pedal pulses. PULMONARY: Lungs are clear to auscultation bilaterally. Nonlabored breathing. Good air movement. ABDOMEN: Soft, non-tender, non-distended      ASSESSMENT AND PLAN: .    Problem List Items Addressed This Visit       Cardiology Problems   Hyperlipidemia with target LDL less than 70 (Chronic)   Known evidence of coronary, aortic and arch vessel atherosclerosis on CT scan.  Target LDL less than 100 if not less than 70. Currently taking atorvastatin 10 mg daily with an LDL of 89.  Relatively  well-controlled. -Continue current dose of atorvastatin, if nausea symptoms persist/recur with lower dose, would convert to rosuvastatin.      Relevant Orders   EKG 12-Lead (Completed)   ECHOCARDIOGRAM COMPLETE   VAS US  CAROTID   Primary hypertension (Chronic)   Blood pressure well-controlled on current dose of lisinopril-HCTZ 20-12.5 mg daily.  No changes for now.  Continue current meds.      Relevant Orders   ECHOCARDIOGRAM COMPLETE   VAS US  CAROTID   Thoracic aortic atherosclerosis (Chronic)   Aortic atherosclerosis with focus on risk factor modification.  On atorvastatin, 1/2 dose due to tablet myalgias at higher dose. LDL 89 mg/dL, target <29 mg/dL.  Blood pressure controlled with lisinopril HCTZ. - Continue atorvastatin, consider dose adjustment to achieve LDL <70 mg/dL. - Continue lisinopril HCTZ for blood pressure management. - Encourage smoking cessation, possibly with Wellbutrin.      Relevant Orders   ECHOCARDIOGRAM COMPLETE   VAS US  CAROTID     Other   Bilateral carotid bruits (Chronic)   In addition to this esophagectomy murmur heard, I  also hear bilateral carotid bruits which may very well be radiated murmur.  However need to exclude carotid disease given the evidence of atherosclerotic disease in the coronary arteries, aortic arch vessels and aorta. -Check carotid Dopplers      Systolic ejection murmur - Primary (Chronic)   Systolic ejection murmur, likely aortic valve Systolic ejection murmur, 3/6, likely from aortic valve, suggestive of sclerosis or stenosis. Asymptomatic, no heart failure or angina. Differential includes aortic stenosis versus sclerosis. - Order echocardiogram to evaluate aortic valve for stenosis or sclerosis. - Consider follow-up echocardiograms: mild stenosis (re-evaluate in 3 years), moderate stenosis (re-evaluate in 1 year), severe stenosis (re-evaluate in 6 months). - Perform carotid ultrasound to assess for carotid artery stenosis.       Relevant Orders   EKG 12-Lead (Completed)   ECHOCARDIOGRAM COMPLETE   VAS US  CAROTID      Follow-Up: Return in about 3 months (around 12/11/2024) for Routine follow up with me, To discuss test results, [employer].    Signed, Alm MICAEL Clay, MD, MS Alm Clay, M.D., M.S. Interventional Cardiologist  [facility] Pager # [phone]

## 2024-09-11 ENCOUNTER — Encounter: Payer: Self-pay | Admitting: Cardiology

## 2024-09-11 DIAGNOSIS — R0989 Other specified symptoms and signs involving the circulatory and respiratory systems: Secondary | ICD-10-CM | POA: Insufficient documentation

## 2024-09-11 NOTE — Assessment & Plan Note (Signed)
 In addition to this esophagectomy murmur heard, I also hear bilateral carotid bruits which may very well be radiated murmur.  However need to exclude carotid disease given the evidence of atherosclerotic disease in the coronary arteries, aortic arch vessels and aorta. -Check carotid Dopplers

## 2024-09-11 NOTE — Assessment & Plan Note (Signed)
 Known evidence of coronary, aortic and arch vessel atherosclerosis on CT scan.  Target LDL less than 100 if not less than 70. Currently taking atorvastatin 10 mg daily with an LDL of 89.  Relatively well-controlled. -Continue current dose of atorvastatin, if nausea symptoms persist/recur with lower dose, would convert to rosuvastatin.

## 2024-09-11 NOTE — Assessment & Plan Note (Signed)
 Aortic atherosclerosis with focus on risk factor modification.  On atorvastatin, 1/2 dose due to tablet myalgias at higher dose. LDL 89 mg/dL, target <29 mg/dL.  Blood pressure controlled with lisinopril HCTZ. - Continue atorvastatin, consider dose adjustment to achieve LDL <70 mg/dL. - Continue lisinopril HCTZ for blood pressure management. - Encourage smoking cessation, possibly with Wellbutrin.

## 2024-09-11 NOTE — Assessment & Plan Note (Signed)
 Systolic ejection murmur, likely aortic valve Systolic ejection murmur, 3/6, likely from aortic valve, suggestive of sclerosis or stenosis. Asymptomatic, no heart failure or angina. Differential includes aortic stenosis versus sclerosis. - Order echocardiogram to evaluate aortic valve for stenosis or sclerosis. - Consider follow-up echocardiograms: mild stenosis (re-evaluate in 3 years), moderate stenosis (re-evaluate in 1 year), severe stenosis (re-evaluate in 6 months). - Perform carotid ultrasound to assess for carotid artery stenosis.

## 2024-09-11 NOTE — Assessment & Plan Note (Signed)
 Blood pressure well-controlled on current dose of lisinopril-HCTZ 20-12.5 mg daily.  No changes for now.  Continue current meds.

## 2024-10-01 ENCOUNTER — Encounter: Payer: Self-pay | Admitting: Emergency Medicine

## 2024-10-03 ENCOUNTER — Ambulatory Visit: Admitting: Emergency Medicine

## 2024-10-03 ENCOUNTER — Encounter

## 2024-10-22 ENCOUNTER — Ambulatory Visit: Payer: Self-pay | Admitting: Cardiology

## 2024-10-22 ENCOUNTER — Ambulatory Visit (HOSPITAL_BASED_OUTPATIENT_CLINIC_OR_DEPARTMENT_OTHER)
Admission: RE | Admit: 2024-10-22 | Discharge: 2024-10-22 | Disposition: A | Discharge status: Home / Self Care | Payer: PRIVATE HEALTH INSURANCE | Source: Ambulatory Visit | Attending: Cardiology | Admitting: Cardiology

## 2024-10-22 ENCOUNTER — Other Ambulatory Visit: Payer: Self-pay | Admitting: Cardiology

## 2024-10-22 ENCOUNTER — Ambulatory Visit (HOSPITAL_COMMUNITY)
Admission: RE | Admit: 2024-10-22 | Discharge: 2024-10-22 | Disposition: A | Discharge status: Home / Self Care | Payer: PRIVATE HEALTH INSURANCE | Source: Ambulatory Visit | Attending: Cardiology | Admitting: Cardiology

## 2024-10-22 DIAGNOSIS — I700 Atherosclerosis of aorta: Secondary | ICD-10-CM | POA: Insufficient documentation

## 2024-10-22 DIAGNOSIS — E785 Hyperlipidemia, unspecified: Secondary | ICD-10-CM

## 2024-10-22 DIAGNOSIS — R011 Cardiac murmur, unspecified: Secondary | ICD-10-CM | POA: Insufficient documentation

## 2024-10-22 DIAGNOSIS — I10 Essential (primary) hypertension: Secondary | ICD-10-CM

## 2024-10-22 DIAGNOSIS — R0989 Other specified symptoms and signs involving the circulatory and respiratory systems: Secondary | ICD-10-CM

## 2024-10-22 LAB — ECHOCARDIOGRAM COMPLETE
Area-P 1/2: 3.45 cm2
S' Lateral: 2.8 cm

## 2024-10-22 NOTE — Progress Notes (Signed)
 Carotid Doppler results: Both internal carotid arteries have mild plaque of less than 40%.  Nonsignificant plaque noted in the left common carotid with no plaque noted in the right common carotid.  The vertebral arteries look fine.  The right subclavian artery has some disturbance of flow which could be to be from blockage or from tortuosity.  The left subclavian artery is fine.  When you get blood pressures checked, we should make sure that they are checked on both arms to make sure there is no difference in pressure.  If there is a difference of pressure, 1 would expect the right side to be less than the left and if so, then we need to do a more detailed evaluation of the subclavian artery.  Lisa Clay, MD

## 2024-12-08 ENCOUNTER — Other Ambulatory Visit: Payer: Self-pay | Admitting: *Deleted

## 2024-12-08 ENCOUNTER — Ambulatory Visit: Payer: PRIVATE HEALTH INSURANCE | Attending: Cardiology | Admitting: Cardiology

## 2024-12-08 ENCOUNTER — Encounter: Payer: Self-pay | Admitting: Cardiology

## 2024-12-08 VITALS — BP 132/70 | HR 76 | Ht 59.0 in | Wt 158.2 lb

## 2024-12-08 DIAGNOSIS — R0989 Other specified symptoms and signs involving the circulatory and respiratory systems: Secondary | ICD-10-CM | POA: Insufficient documentation

## 2024-12-08 DIAGNOSIS — I700 Atherosclerosis of aorta: Secondary | ICD-10-CM | POA: Insufficient documentation

## 2024-12-08 DIAGNOSIS — E785 Hyperlipidemia, unspecified: Secondary | ICD-10-CM | POA: Insufficient documentation

## 2024-12-08 DIAGNOSIS — R011 Cardiac murmur, unspecified: Secondary | ICD-10-CM | POA: Insufficient documentation

## 2024-12-08 DIAGNOSIS — I10 Essential (primary) hypertension: Secondary | ICD-10-CM | POA: Diagnosis present

## 2024-12-08 NOTE — Progress Notes (Signed)
 " Cardiology Office Note:  .   Date:  12/13/2024  ID:  Franne Hutchinson, DOB 1956/09/25, MRN [id number] PCP: Scifres, Naomie RIGGERS (Inactive)  [facility] Providers Cardiologist:  Alm Clay, MD     Chief Complaint  Patient presents with   Follow-up    She is here to discuss test results of echocardiogram and carotid Dopplers.  No symptoms.    Patient Profile: .     Kash Mothershead is a 69 y.o. female smoker with a PMH notable for hypertension and hyperlipidemia as well as aortic atherosclerosis, (and reported history of rheumatic fever as a child) who presents here for 26-month follow-up to discuss test results.  She was initially referred at the request of Lyle Evener, NP.  She reports that she was told that she had Rheumatic Fever as a child.      Rabab Currington was seen for initial consult on 09/10/2024 for evaluation of heart murmur.  No symptoms.  No studies.  No dyspnea or chest pain.  Occasional LEG PAIN at night -NOT with walking.  Some exercise intolerance due to being sedentary.  Subjective  Discussed the use of AI scribe software for clinical note transcription with the patient, who gave verbal consent to proceed.  History of Present Illness Takelia Urieta is a 69 year old female who presents for evaluation of a heart murmur. She was referred by Lyle Bertrand, NP, for evaluation of a heart murmur.  She has a history of rheumatic fever as a young child. Several diagnostic studies have been conducted, including a CT scan in December, which showed stable findings with no significant changes from a previous PET test in May.  She has a history of emphysema but experiences no shortness of breath, chest pain, heart palpitations, or syncope during routine activities. She maintains an active lifestyle, walking several miles three times a week without difficulty. She experiences occasional leg pain at night, which resolved after reducing the diuretic component of her lisinopril HCTZ  medication.  Her current medications include atorvastatin 20 mg and lisinopril HCTZ, with no recent changes except for the reduction in the diuretic dose. She also takes a vitamin supplement.  She has a history of smoking and continues to smoke.  Cardiovascular ROS: no chest pain or dyspnea on exertion negative for - edema, murmur, orthopnea, palpitations, paroxysmal nocturnal dyspnea, rapid heart rate, shortness of breath, or lightheadedness, dizziness or wooziness, syncope or near significant TIA or amaurosis fugax, claudication     Objective   Past Medical History - Hypertension - Hyperlipidemia - Aortic atherosclerosis - Tobacco use disorder - Lung nodules - Anxiety - Rheumatic fever (childhood)   Family History - Mother: heart disease, bypass surgery - Father: bladder cancer - Brother: died from the flu,  - Sister: diabetes, morbid obesity - No family history of heart disease in father.   Social History - Tobacco: Current smoker, 0.4 ppd - Employment: Retired - Patient stays active by mowing the yard and exercising regularly.   Medications - Lisinopril HCTZ 20/12.5 mg - Atorvastatin 20 mg 1/2 tab a day - Wellbutrin 150 mg - Prozac 40 mg - Multivitamin  Studies Reviewed: SABRA        ECHO 10/22/2024: EF 60 to 65%.  No RWMA.  Normal diastolic parameters.  Normal RV size and function.  Normal RAP and RVSP.  Aortic valve does not show any evidence of stenosis or significant sclerosis.  Normal mitral valve. (On my initial evaluation I suspect that there is little  bit of calcification on one of them aortic valve leaflets that can be causing turbulence and flow there for murmur). Bilateral Carotid Duplex 10/22/2024: Bilateral ICA velocities consistent with 1-39% stenosis.  Nonhemodynamically significant plaque less than 50% in the left CCA.  Bilateral vertebral arteries with antegrade flow.  Mild disturbance in right subclavian flow with normal left subclavian flow.  Chest CT  11/20/2023: 10 scattered pulmonary nodules similar to May 2024.  Coronary artery, aortic arch and branch vessel atherosclerosis.  Emphysema.  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   Risk Assessment/Calculations:          Physical Exam:   VS:  BP 132/70   Pulse 76   Ht 4' 11 (1.499 m)   Wt 158 lb 3.2 oz (71.8 kg)   SpO2 94%   BMI 31.95 kg/m    Wt Readings from Last 3 Encounters:  12/08/24 158 lb 3.2 oz (71.8 kg)  09/10/24 159 lb 3.2 oz (72.2 kg)  12/04/23 168 lb 3.2 oz (76.3 kg)    GEN: Well nourished, well developed in no acute distress; mildly obese NECK: No JVD; No carotid bruits CARDIAC: Normal S1, S2; RRR, ; harsh 3/ SEM at RUSB - carotids, NO rubs, gallops RESPIRATORY:  Clear to auscultation without rales, wheezing or rhonchi ; nonlabored, good air movement. ABDOMEN: Soft, non-tender, non-distended EXTREMITIES:  No edema; No deformity      ASSESSMENT AND PLAN: .    Systolic ejection murmur Assessment & Plan: Systolic ejection murmur with age-related aortic valve thickening Murmur due to age-related aortic valve thickening without narrowing or dysfunction. Normal heart function with ejection fraction 60-65%. No symptoms present. => No evidence of stenosis or abnormal function. - Continue current management. - Re-evaluate in 3-5 years unless symptoms develop.   Primary hypertension Assessment & Plan: Blood pressure well-controlled with current medication: Lisinopril and HCTZ 20-12.5 mg daily - Continue current antihypertensive medications.   Thoracic aortic atherosclerosis Assessment & Plan: CT shows arterial calcium consistent with emphysema. Carotid ultrasound shows <50% plaque in left common carotid artery. No elevated pulmonary pressures or heart dysfunction. - Continue managing cholesterol and blood pressure. - Encouraged smoking cessation.   Bilateral carotid bruits Assessment & Plan: Carotid Dopplers did not show  any evidence of significant stenosis.  I suspect that the bruits actually just radiated systolic ejection murmur.   Hyperlipidemia LDL goal <100 Assessment & Plan: Cholesterol well-controlled with rosuvastatin. LDL 89 in October, satisfactory. =>  - Continue atorvastatin 20 mg daily   - Would prefer to shoot for LDL less than 70.  If the trend goes upward, would consider increasing dose.        Follow-Up: Return in about 3 years (around 09/04/2027).      Signed, Alm MICAEL Clay, MD, MS Alm Clay, M.D., M.S. Interventional Cardiologist  [facility] Pager # [phone]      "

## 2024-12-08 NOTE — Patient Instructions (Signed)
 Medication Instructions:   No changes *If you need a refill on your cardiac medications before your next appointment, please call your pharmacy*   Lab Work: Not neeed   Testing/Procedures:  Not needed  Follow-Up: At [facility], you and your health needs are our priority.  As part of our continuing mission to provide you with exceptional heart care, we have created designated Provider Care Teams.  These Care Teams include your primary Cardiologist (physician) and Advanced Practice Providers (APPs -  Physician Assistants and Nurse Practitioners) who all work together to provide you with the care you need, when you need it.     Your next appointment:   3 year(s) Oct 2028  The format for your next appointment:   In Person  Provider:   Alm Clay, MD

## 2024-12-13 ENCOUNTER — Encounter: Payer: Self-pay | Admitting: Cardiology

## 2024-12-13 NOTE — Assessment & Plan Note (Signed)
 Blood pressure well-controlled with current medication: Lisinopril and HCTZ 20-12.5 mg daily - Continue current antihypertensive medications.

## 2024-12-13 NOTE — Assessment & Plan Note (Signed)
 CT shows arterial calcium consistent with emphysema. Carotid ultrasound shows <50% plaque in left common carotid artery. No elevated pulmonary pressures or heart dysfunction. - Continue managing cholesterol and blood pressure. - Encouraged smoking cessation.

## 2024-12-13 NOTE — Assessment & Plan Note (Signed)
 Carotid Dopplers did not show any evidence of significant stenosis.  I suspect that the bruits actually just radiated systolic ejection murmur.

## 2024-12-13 NOTE — Assessment & Plan Note (Signed)
 Systolic ejection murmur with age-related aortic valve thickening Murmur due to age-related aortic valve thickening without narrowing or dysfunction. Normal heart function with ejection fraction 60-65%. No symptoms present. => No evidence of stenosis or abnormal function. - Continue current management. - Re-evaluate in 3-5 years unless symptoms develop.

## 2024-12-13 NOTE — Assessment & Plan Note (Signed)
 Cholesterol well-controlled with rosuvastatin. LDL 89 in October, satisfactory. =>  - Continue atorvastatin 20 mg daily   - Would prefer to shoot for LDL less than 70.  If the trend goes upward, would consider increasing dose.

## 2024-12-18 ENCOUNTER — Other Ambulatory Visit: Payer: Self-pay | Admitting: *Deleted

## 2024-12-18 DIAGNOSIS — R918 Other nonspecific abnormal finding of lung field: Secondary | ICD-10-CM

## 2024-12-18 DIAGNOSIS — J432 Centrilobular emphysema: Secondary | ICD-10-CM

## 2024-12-19 ENCOUNTER — Ambulatory Visit (INDEPENDENT_AMBULATORY_CARE_PROVIDER_SITE_OTHER): Payer: PRIVATE HEALTH INSURANCE | Admitting: *Deleted

## 2024-12-19 ENCOUNTER — Ambulatory Visit: Payer: PRIVATE HEALTH INSURANCE | Admitting: Emergency Medicine

## 2024-12-19 ENCOUNTER — Encounter: Payer: Self-pay | Admitting: Emergency Medicine

## 2024-12-19 VITALS — BP 135/69 | HR 74 | Ht <= 58 in | Wt 159.0 lb

## 2024-12-19 DIAGNOSIS — J432 Centrilobular emphysema: Secondary | ICD-10-CM | POA: Diagnosis not present

## 2024-12-19 DIAGNOSIS — F17210 Nicotine dependence, cigarettes, uncomplicated: Secondary | ICD-10-CM

## 2024-12-19 DIAGNOSIS — J449 Chronic obstructive pulmonary disease, unspecified: Secondary | ICD-10-CM | POA: Diagnosis not present

## 2024-12-19 DIAGNOSIS — Z720 Tobacco use: Secondary | ICD-10-CM | POA: Insufficient documentation

## 2024-12-19 DIAGNOSIS — R918 Other nonspecific abnormal finding of lung field: Secondary | ICD-10-CM

## 2024-12-19 LAB — PULMONARY FUNCTION TEST
DL/VA % pred: 87 %
DL/VA: 3.86 ml/min/mmHg/L
DLCO cor % pred: 80 %
DLCO cor: 12.36 ml/min/mmHg
DLCO unc % pred: 80 %
DLCO unc: 12.36 ml/min/mmHg
FEF 25-75 Post: 1.66 L/s
FEF 25-75 Pre: 1.71 L/s
FEF2575-%Change-Post: -2 %
FEF2575-%Pred-Post: 100 %
FEF2575-%Pred-Pre: 103 %
FEV1-%Change-Post: 0 %
FEV1-%Pred-Post: 113 %
FEV1-%Pred-Pre: 113 %
FEV1-Post: 1.96 L
FEV1-Pre: 1.97 L
FEV1FVC-%Change-Post: 0 %
FEV1FVC-%Pred-Pre: 101 %
FEV6-%Change-Post: -1 %
FEV6-%Pred-Post: 114 %
FEV6-%Pred-Pre: 115 %
FEV6-Post: 2.49 L
FEV6-Pre: 2.53 L
FEV6FVC-%Change-Post: 0 %
FEV6FVC-%Pred-Post: 104 %
FEV6FVC-%Pred-Pre: 104 %
FVC-%Change-Post: 0 %
FVC-%Pred-Post: 110 %
FVC-%Pred-Pre: 111 %
FVC-Post: 2.53 L
FVC-Pre: 2.54 L
Post FEV1/FVC ratio: 77 %
Post FEV6/FVC ratio: 100 %
Pre FEV1/FVC ratio: 78 %
Pre FEV6/FVC Ratio: 99 %
RV % pred: 79 %
RV: 1.41 L
TLC % pred: 99 %
TLC: 3.97 L

## 2024-12-19 NOTE — Progress Notes (Signed)
 "  Subjective:    Patient ID: Lisa Montes, female    DOB: [birth year], 69 y.o.   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  She had a reassuring PET scan, negative QuantiFERON gold and negative HSP panel.  ANCA, ACE level, RF all reassuring.  She had a weakly positive ANA 1: 80.  Her most recent CT chest was done 11/2023 and this was stable.  PFT were done today. Today she reports that has been feeling about the same, doesn't really notice SOB but she does cough, sometimes productive. Sometimes sneezes. She had a bronchitis in early 2025 and the cough persisted for weeks-to-months. She was given albuterol but has never used. She can be active - walks regularly. She has cut her cigarettes down a lot - 8 cig a day.   Pulmonary function testing 12/19/2024 reviewed by me, shows normal airflows without a bronchodilator response, normal TLC with a slightly decreased RV suggestive of possible restriction, normal diffusion capacity, normal flow-volume loop.  CT chest 11/09/2023 reviewed by me, shows scattered pulmonary nodules and some associated scattered upper lobe predominant ground glass infiltrate and airways thickening.  No significant change compared with 04/11/2023.  Annual follow-up recommended  Review of Systems As per HPI  Past Medical History:  Diagnosis Date   Abnormal chest CT    Anxiety    Aortic atherosclerosis    Atherosclerosis of aorta    Depression    Heart murmur    HLD (hyperlipidemia)    HTN (hypertension)    Multiple nodules of lung    Obesity    Psoriasis    Pulmonary nodules 04/19/2023   Sleep disturbance    Stress incontinence    Tobacco use    Vitamin B12 deficiency    Vitamin D deficiency      Family History  Problem Relation Age of  Onset   Hypertension Mother    Hyperlipidemia Mother    Kidney disease Mother    Bladder Cancer Father    Hypertension Sister    Diabetes Mellitus I Sister    Hyperlipidemia Sister    Hypertension Brother    Diabetes Mellitus I Paternal Grandfather     No family hx lung CA Has lived in [place], [place] Cleaning chemicals No mold exposure  Social History   Socioeconomic History   Marital status: Unknown    Spouse name: Not on file   Number of children: Not on file   Years of education: Not on file   Highest education level: Not on file  Occupational History   Not on file  Tobacco Use   Smoking status: Every Day    Current packs/day: 1.00    Average packs/day: 1 pack/day for 50.0 years (50.0 ttl pk-yrs)    Types: Cigarettes   Smokeless tobacco: Not on file   Tobacco comments:    8 cigarettes smoked daily hfb 12/04/2023  Substance and Sexual Activity   Alcohol use: Not on file   Drug use: Not on file   Sexual activity: Not on file  Other Topics Concern   Not on file  Social History Narrative   Not on file   Social Drivers of Health   Tobacco Use: High Risk (12/19/2024)   Patient History    Smoking Tobacco Use: Every Day  Smokeless Tobacco Use: Unknown    Passive Exposure: Not on file  Financial Resource Strain: Not on file  Food Insecurity: Not on file  Transportation Needs: Not on file  Physical Activity: Not on file  Stress: Not on file  Social Connections: Not on file  Intimate Partner Violence: Not on file  Depression (PHQ2-9): Not on file  Alcohol Screen: Not on file  Housing: Not on file  Utilities: Not on file  Health Literacy: Not on file    She is a medical   Allergies  Allergen Reactions   Paxil [Paroxetine Hcl]     Lack of therapeutic effect   Wellbutrin [Bupropion]     Lack of effect     Outpatient Medications Prior to Visit  Medication Sig Dispense Refill   atorvastatin (LIPITOR) 20 MG tablet SMARTSIG:1 Tablet(s) By Mouth Every Evening  (Patient taking differently: Take 10 mg by mouth daily.)     buPROPion (WELLBUTRIN XL) 150 MG 24 hr tablet      clobetasol ointment (TEMOVATE) 0.05 % Apply 1 application  topically 2 (two) times daily.     FLUoxetine (PROZAC) 40 MG capsule Take 40 mg by mouth daily.     lisinopril-hydrochlorothiazide (ZESTORETIC) 20-12.5 MG tablet Take 1 tablet by mouth daily.     No facility-administered medications prior to visit.        Objective:   Physical Exam  Vitals:   12/19/24 1047  BP: 135/69  Pulse: 74  SpO2: 97%  Weight: 159 lb (72.1 kg)  Height: 4' 9 (1.448 m)   Gen: Pleasant, overweight woman, in no distress,  normal affect  ENT: No lesions,  mouth clear,  oropharynx clear, no postnasal drip  Neck: No JVD, no stridor  Lungs: No use of accessory muscles, no crackles or wheezing on normal respiration, no wheeze on forced expiration  Cardiovascular: RRR, heart sounds normal, no murmur or gallops, no peripheral edema  Musculoskeletal: No deformities, no cyanosis or clubbing  Neuro: alert, awake, non focal  Skin: Warm, no lesions or rash      Assessment & Plan:  COPD (chronic obstructive pulmonary disease) (HCC) Suspect some mild subclinical COPD based on her intermittent cough and some emphysematous changes on her CT chest.  That said her pulmonary function testing today shows normal airflows.  Reassured her about this.  In absence of symptoms I think we can hold off on BD therapy for now.  She has an albuterol that she could use if needed.  Most important thing for us  to do now is to work on smoking cessation  Tobacco use Discussed smoking cessation and smoking cessation strategies with her.  For now we have set a goal to try and wean.  She is currently using 8 cigarettes daily.  Once she has decreased hopefully we will be able to consider setting a quit date.  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  There was a stable interval between her original scan and December 2024.  She is overdue for annual follow-up.  We will check a CT chest now.  Discussed with her that she has some risk for pulmonary nodule being malignancy as opposed to inflammatory.  If there is increased suspicion on follow-up imaging then we can talk about appropriate diagnostics including possible bronchoscopy.  I personally spent a total of 33 minutes in the care of the patient today including preparing to see the patient, getting/reviewing separately obtained history, performing  a medically appropriate exam/evaluation, counseling and educating, placing orders, documenting clinical information in the EHR, independently interpreting results, and communicating results.   Lamar Chris, MD, PhD 12/19/2024, 11:40 AM [facility] [phone] or if no answer before 7:00PM call [phone] For any issues after 7:00PM please call eLink [phone]   "

## 2024-12-19 NOTE — Patient Instructions (Signed)
 We reviewed your pulmonary function testing today.  This shows overall normal airflows.  Good news. We will hold off on starting any kind of inhaled medication at this time. Continue to work on decreasing your cigarettes.  Our ultimate goal will be to stop altogether.  When you have been able to decrease further we may be able to start discussing setting a quit date. We will plan to repeat your CT scan of the chest now to follow some lung inflammation and small pulmonary nodules. Please follow with Dr. Shelah in about 1 month so we can review your CT scan results.

## 2024-12-19 NOTE — Progress Notes (Signed)
 Full PFT performed today.

## 2024-12-19 NOTE — Assessment & Plan Note (Signed)
 Discussed smoking cessation and smoking cessation strategies with her.  For now we have set a goal to try and wean.  She is currently using 8 cigarettes daily.  Once she has decreased hopefully we will be able to consider setting a quit date.

## 2024-12-19 NOTE — Assessment & Plan Note (Signed)
 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  There was a stable interval between her original scan and December 2024.  She is overdue for annual follow-up.  We will check a CT chest now.  Discussed with her that she has some risk for pulmonary nodule being malignancy as opposed to inflammatory.  If there is increased suspicion on follow-up imaging then we can talk about appropriate diagnostics including possible bronchoscopy.

## 2024-12-19 NOTE — Assessment & Plan Note (Signed)
 Suspect some mild subclinical COPD based on her intermittent cough and some emphysematous changes on her CT chest.  That said her pulmonary function testing today shows normal airflows.  Reassured her about this.  In absence of symptoms I think we can hold off on BD therapy for now.  She has an albuterol that she could use if needed.  Most important thing for us  to do now is to work on smoking cessation

## 2024-12-19 NOTE — Patient Instructions (Signed)
 Full PFT performed today.

## 2024-12-25 ENCOUNTER — Ambulatory Visit (HOSPITAL_BASED_OUTPATIENT_CLINIC_OR_DEPARTMENT_OTHER)
Admission: RE | Admit: 2024-12-25 | Discharge: 2024-12-25 | Disposition: A | Discharge status: Home / Self Care | Payer: PRIVATE HEALTH INSURANCE | Source: Ambulatory Visit | Attending: Emergency Medicine | Admitting: Emergency Medicine

## 2024-12-25 DIAGNOSIS — R918 Other nonspecific abnormal finding of lung field: Secondary | ICD-10-CM | POA: Diagnosis present

## 2025-01-01 ENCOUNTER — Ambulatory Visit: Payer: PRIVATE HEALTH INSURANCE | Admitting: Emergency Medicine

## 2025-01-07 ENCOUNTER — Ambulatory Visit: Payer: Self-pay | Admitting: Emergency Medicine

## 2025-01-08 NOTE — Progress Notes (Signed)
 Pt and notified of RB result note & verbalized understanding. Pt will f/u 01/28/25. Nothing further needed.

## 2025-01-28 ENCOUNTER — Ambulatory Visit: Payer: PRIVATE HEALTH INSURANCE | Admitting: Emergency Medicine
